# Patient Record
Sex: Female | Born: 1985 | Race: Black or African American | Hispanic: No | Marital: Single | Smoking: Former smoker
Health system: Southern US, Community
[De-identification: ages and names within clinical notes are randomized; demographics above are authoritative.]

## PROBLEM LIST (undated history)

## (undated) ENCOUNTER — Inpatient Hospital Stay (HOSPITAL_COMMUNITY): Payer: Self-pay

## (undated) DIAGNOSIS — D649 Anemia, unspecified: Secondary | ICD-10-CM

---
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
## 2006-05-12 ENCOUNTER — Inpatient Hospital Stay (HOSPITAL_COMMUNITY): Admission: AD | Admit: 2006-05-12 | Discharge: 2006-05-13 | Payer: Self-pay | Admitting: Obstetrics & Gynecology

## 2006-10-24 ENCOUNTER — Inpatient Hospital Stay (HOSPITAL_COMMUNITY): Admission: AD | Admit: 2006-10-24 | Discharge: 2006-10-24 | Payer: Self-pay | Admitting: Gynecology

## 2006-10-24 IMAGING — US US OB TRANSVAGINAL MODIFY
1 series · 14 of 28 positions shown · non-contrast
Comparison: none

CLINICAL DATA: 20-year-old, gravida 1, para 0, with LMP [DATE].  Pain, nausea.  

 OBSTETRICAL ULTRASOUND <14 WKS AND TRANSVAGINAL OB US:
 Number of Fetuses:  1
 Heart Rate:  136 bpm
 CRL:  0.73 cm  6 w 4 d     US EDC:  [DATE]
 Fetal anatomy could not be evaluated due to the early gestational age.
 MATERNAL UTERINE AND ADNEXAL FINDINGS:
 Cervix:  Closed; not measured.

[Series 1: us ob transvaginal modify · 0.24mm/px · 14 of 39 slices shown]
[im 2/39]
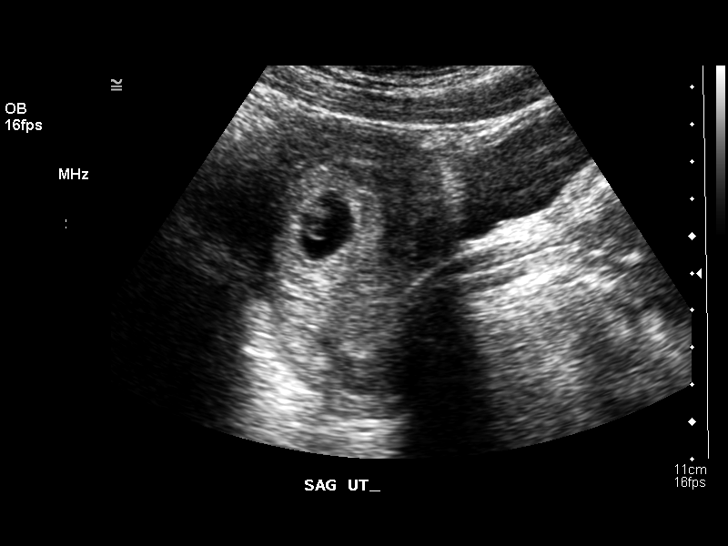
[im 5/39]
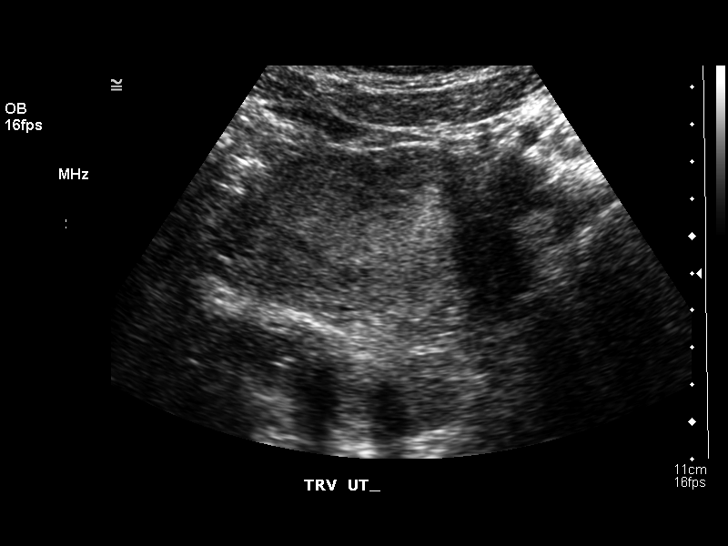
[im 8/39]
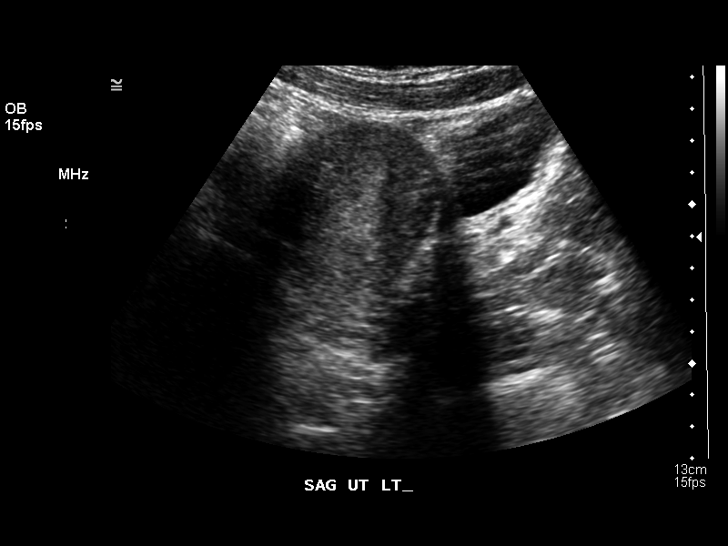
[im 10/39]
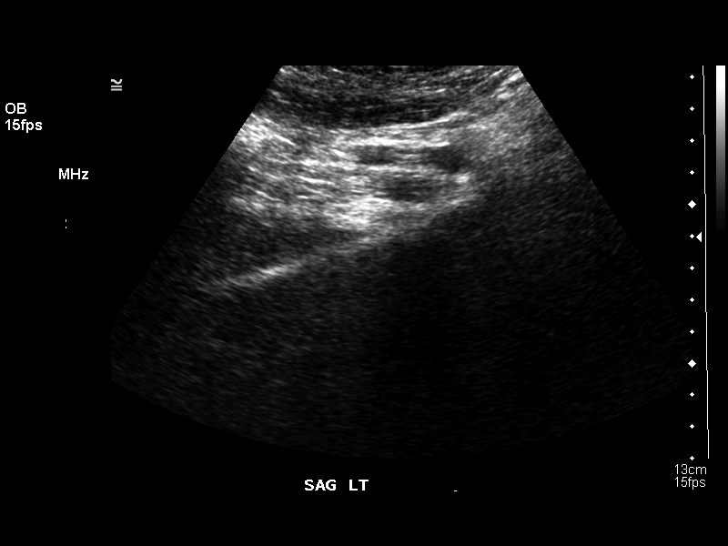
[im 13/39]
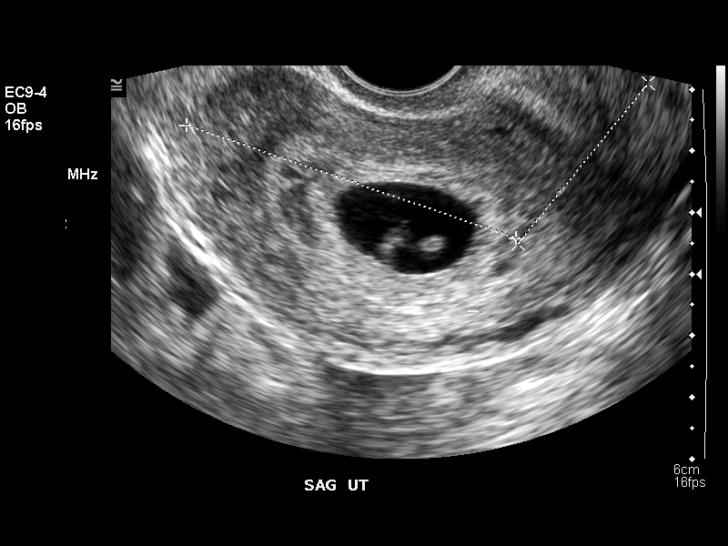
[im 16/39]
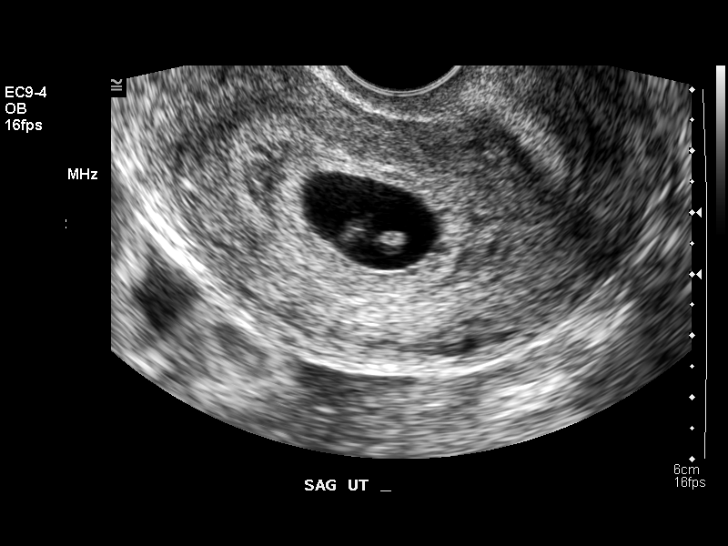
[im 19/39]
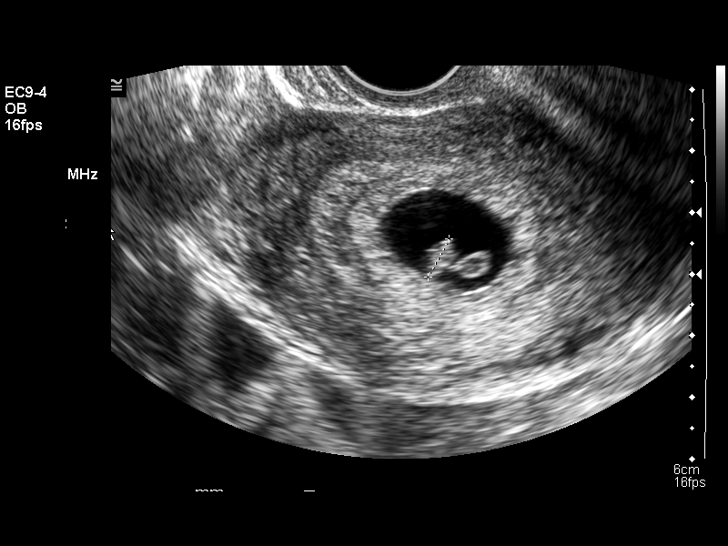
[im 22/39]
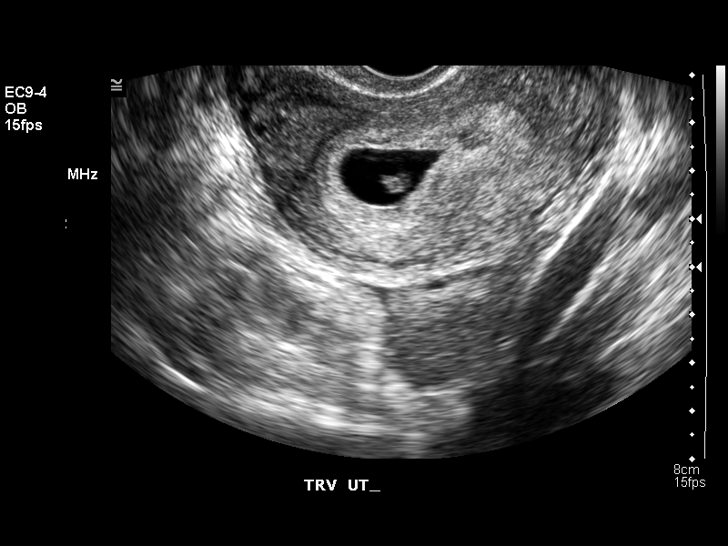
[im 24/39]
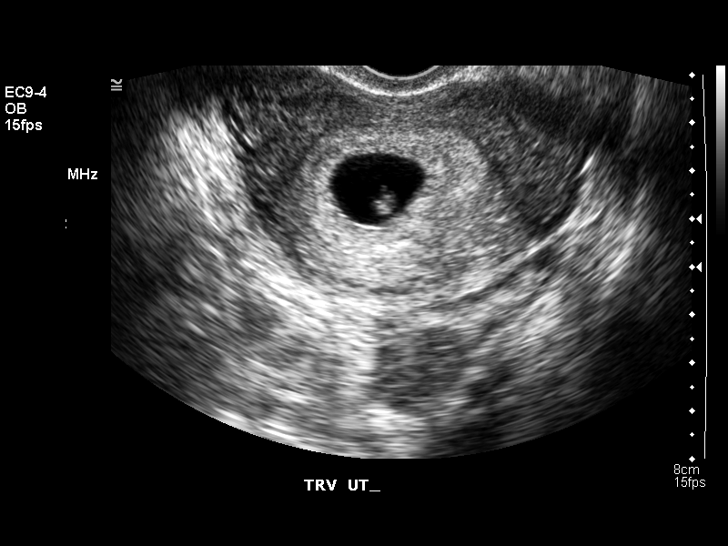
[im 27/39]
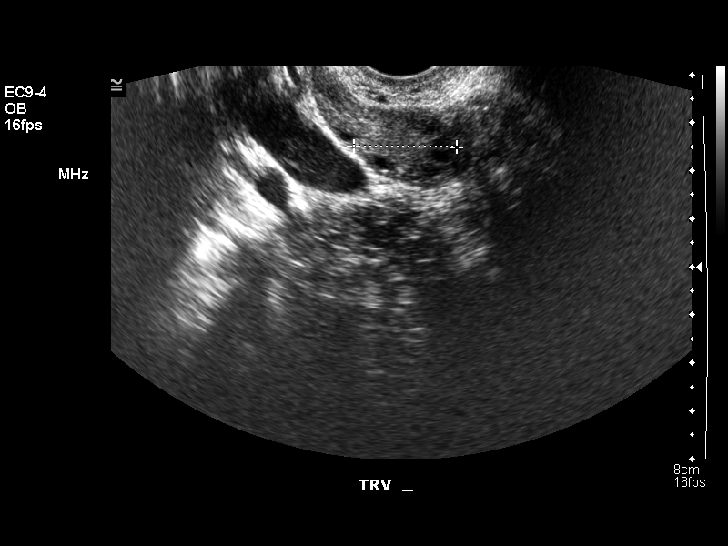
[im 30/39]
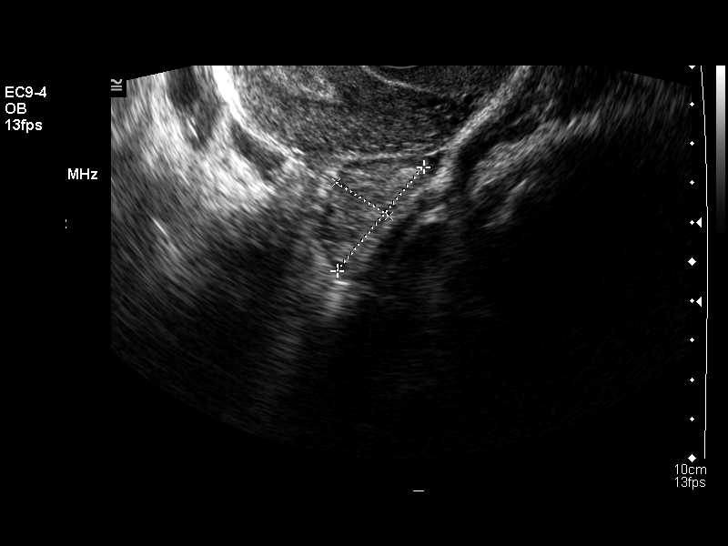
[im 33/39]
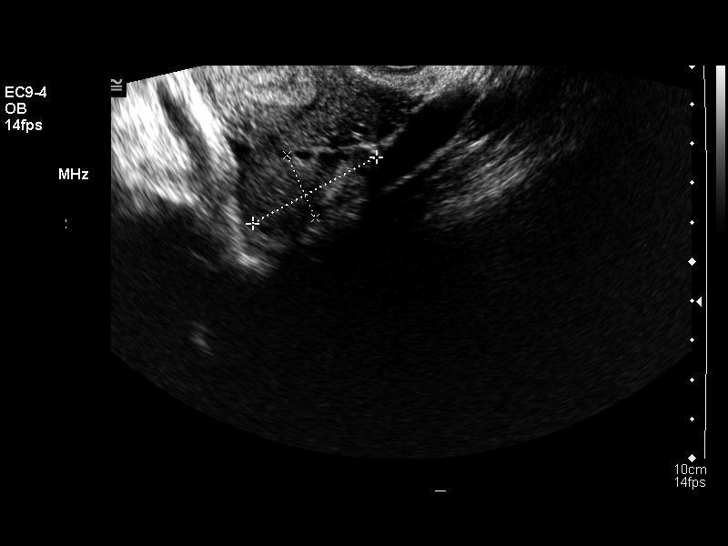
[im 36/39]
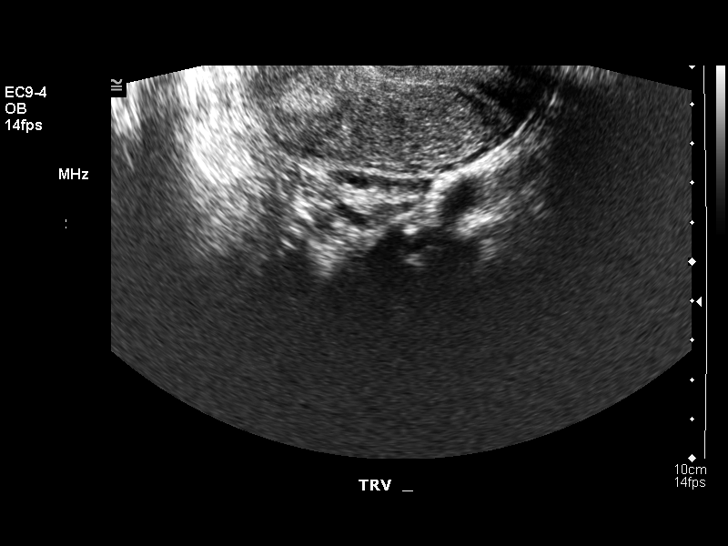
[im 39/39]
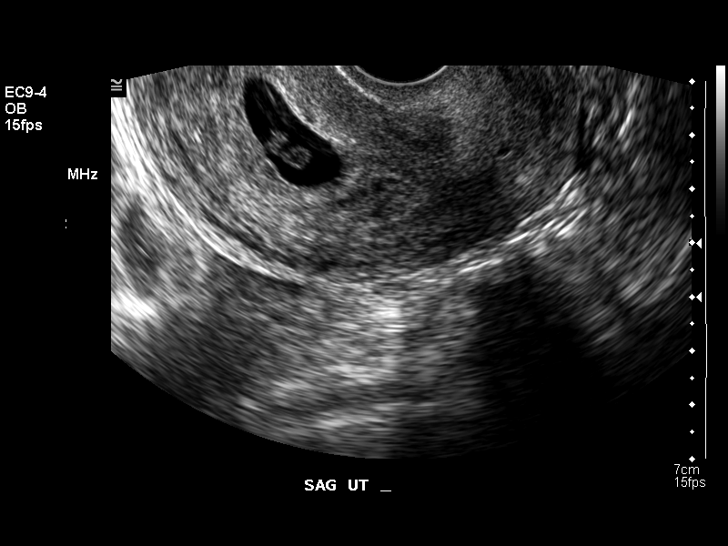

[14 of 28 positions shown; findings below may reference images not displayed]

IMPRESSION: Single living intrauterine fetus in variable presentation.  Size by ultrasound is within 4 days of the dating by the patient?s last menstrual period.  EDC by LMP is [DATE].

## 2006-11-30 ENCOUNTER — Inpatient Hospital Stay (HOSPITAL_COMMUNITY): Admission: AD | Admit: 2006-11-30 | Discharge: 2006-11-30 | Payer: Self-pay | Admitting: Obstetrics & Gynecology

## 2006-12-06 ENCOUNTER — Ambulatory Visit (HOSPITAL_COMMUNITY): Admission: RE | Admit: 2006-12-06 | Discharge: 2006-12-06 | Payer: Self-pay | Admitting: Family Medicine

## 2006-12-27 ENCOUNTER — Ambulatory Visit (HOSPITAL_COMMUNITY): Admission: RE | Admit: 2006-12-27 | Discharge: 2006-12-27 | Payer: Self-pay | Admitting: *Deleted

## 2007-01-09 ENCOUNTER — Ambulatory Visit (HOSPITAL_COMMUNITY): Admission: RE | Admit: 2007-01-09 | Discharge: 2007-01-09 | Payer: Self-pay | Admitting: Family Medicine

## 2007-05-04 ENCOUNTER — Inpatient Hospital Stay (HOSPITAL_COMMUNITY): Admission: AD | Admit: 2007-05-04 | Discharge: 2007-05-04 | Payer: Self-pay | Admitting: Obstetrics & Gynecology

## 2007-06-07 ENCOUNTER — Inpatient Hospital Stay (HOSPITAL_COMMUNITY): Admission: AD | Admit: 2007-06-07 | Discharge: 2007-06-07 | Payer: Self-pay | Admitting: Obstetrics & Gynecology

## 2007-06-11 ENCOUNTER — Inpatient Hospital Stay (HOSPITAL_COMMUNITY): Admission: AD | Admit: 2007-06-11 | Discharge: 2007-06-11 | Payer: Self-pay | Admitting: Obstetrics & Gynecology

## 2007-06-12 ENCOUNTER — Inpatient Hospital Stay (HOSPITAL_COMMUNITY): Admission: AD | Admit: 2007-06-12 | Discharge: 2007-06-12 | Payer: Self-pay | Admitting: Obstetrics & Gynecology

## 2007-06-13 ENCOUNTER — Inpatient Hospital Stay (HOSPITAL_COMMUNITY): Admission: AD | Admit: 2007-06-13 | Discharge: 2007-06-16 | Payer: Self-pay | Admitting: Obstetrics

## 2007-06-14 ENCOUNTER — Encounter: Payer: Self-pay | Admitting: Obstetrics & Gynecology

## 2007-11-29 ENCOUNTER — Inpatient Hospital Stay (HOSPITAL_COMMUNITY): Admission: AD | Admit: 2007-11-29 | Discharge: 2007-11-29 | Payer: Self-pay | Admitting: Obstetrics & Gynecology

## 2007-12-02 ENCOUNTER — Inpatient Hospital Stay (HOSPITAL_COMMUNITY): Admission: AD | Admit: 2007-12-02 | Discharge: 2007-12-02 | Payer: Self-pay | Admitting: Obstetrics & Gynecology

## 2007-12-26 ENCOUNTER — Inpatient Hospital Stay (HOSPITAL_COMMUNITY): Admission: AD | Admit: 2007-12-26 | Discharge: 2007-12-26 | Payer: Self-pay | Admitting: Family Medicine

## 2008-02-21 ENCOUNTER — Ambulatory Visit (HOSPITAL_COMMUNITY): Admission: RE | Admit: 2008-02-21 | Discharge: 2008-02-21 | Payer: Self-pay | Admitting: Obstetrics & Gynecology

## 2008-06-10 ENCOUNTER — Inpatient Hospital Stay (HOSPITAL_COMMUNITY): Admission: AD | Admit: 2008-06-10 | Discharge: 2008-06-10 | Payer: Self-pay | Admitting: Obstetrics and Gynecology

## 2008-06-13 ENCOUNTER — Ambulatory Visit (HOSPITAL_COMMUNITY): Admission: RE | Admit: 2008-06-13 | Discharge: 2008-06-13 | Payer: Self-pay | Admitting: Obstetrics & Gynecology

## 2008-07-11 ENCOUNTER — Inpatient Hospital Stay (HOSPITAL_COMMUNITY): Admission: AD | Admit: 2008-07-11 | Discharge: 2008-07-13 | Payer: Self-pay | Admitting: Obstetrics & Gynecology

## 2009-05-14 ENCOUNTER — Inpatient Hospital Stay (HOSPITAL_COMMUNITY): Admission: AD | Admit: 2009-05-14 | Discharge: 2009-05-14 | Payer: Self-pay | Admitting: Obstetrics & Gynecology

## 2009-10-29 ENCOUNTER — Emergency Department (HOSPITAL_COMMUNITY): Admission: EM | Admit: 2009-10-29 | Discharge: 2009-10-29 | Payer: Self-pay | Admitting: Family Medicine

## 2009-11-15 ENCOUNTER — Emergency Department (HOSPITAL_COMMUNITY): Admission: EM | Admit: 2009-11-15 | Discharge: 2009-11-15 | Payer: Self-pay | Admitting: Emergency Medicine

## 2010-10-01 ENCOUNTER — Emergency Department (HOSPITAL_COMMUNITY): Admission: EM | Admit: 2010-10-01 | Discharge: 2010-10-01 | Payer: Self-pay | Admitting: Family Medicine

## 2010-11-25 ENCOUNTER — Emergency Department (HOSPITAL_COMMUNITY)
Admission: EM | Admit: 2010-11-25 | Discharge: 2010-11-25 | Payer: Self-pay | Source: Home / Self Care | Admitting: Emergency Medicine

## 2010-12-20 NOTE — L&D Delivery Note (Signed)
Delivery Note At 7:45 AM a viable female was delivered via Vaginal, Spontaneous Delivery (Presentation: ;Vtx ).   Placenta status: Intact, Spontaneous.  Cord: 3 vessels.    Anesthesia: None  Episiotomy: None Lacerations: None  Est. Blood Loss (mL): 300 ml  Mom to postpartum.  Baby to NICU.  JACKSON-MOORE,Shogo Larkey A 10/02/2011, 8:01 AM

## 2011-03-03 LAB — POCT URINALYSIS DIPSTICK
Bilirubin Urine: NEGATIVE
Glucose, UA: NEGATIVE mg/dL
Ketones, ur: NEGATIVE mg/dL
Nitrite: NEGATIVE
Protein, ur: NEGATIVE mg/dL
Specific Gravity, Urine: 1.01 (ref 1.005–1.030)
Urobilinogen, UA: 0.2 mg/dL (ref 0.0–1.0)
pH: 5.5 (ref 5.0–8.0)

## 2011-03-03 LAB — POCT PREGNANCY, URINE: Preg Test, Ur: NEGATIVE

## 2011-03-24 LAB — URINALYSIS, ROUTINE W REFLEX MICROSCOPIC
Bilirubin Urine: NEGATIVE
Glucose, UA: NEGATIVE mg/dL
Hgb urine dipstick: NEGATIVE
Ketones, ur: NEGATIVE mg/dL
Nitrite: NEGATIVE
Protein, ur: 30 mg/dL — AB
Specific Gravity, Urine: 1.019 (ref 1.005–1.030)
Urobilinogen, UA: 0.2 mg/dL (ref 0.0–1.0)
pH: 6.5 (ref 5.0–8.0)

## 2011-03-24 LAB — URINE MICROSCOPIC-ADD ON

## 2011-03-24 LAB — POCT INFECTIOUS MONO SCREEN: Mono Screen: NEGATIVE

## 2011-03-30 LAB — URINALYSIS, ROUTINE W REFLEX MICROSCOPIC
Bilirubin Urine: NEGATIVE
Hgb urine dipstick: NEGATIVE
Nitrite: NEGATIVE
Specific Gravity, Urine: 1.015 (ref 1.005–1.030)
Urobilinogen, UA: 0.2 mg/dL (ref 0.0–1.0)
pH: 6 (ref 5.0–8.0)

## 2011-04-02 ENCOUNTER — Inpatient Hospital Stay (HOSPITAL_COMMUNITY)
Admission: AD | Admit: 2011-04-02 | Discharge: 2011-04-02 | Disposition: A | Discharge status: Home / Self Care | Payer: Medicaid Other | Source: Ambulatory Visit | Attending: Obstetrics & Gynecology | Admitting: Obstetrics & Gynecology

## 2011-04-02 DIAGNOSIS — O9989 Other specified diseases and conditions complicating pregnancy, childbirth and the puerperium: Secondary | ICD-10-CM

## 2011-04-02 DIAGNOSIS — O99891 Other specified diseases and conditions complicating pregnancy: Secondary | ICD-10-CM | POA: Insufficient documentation

## 2011-04-02 LAB — URINALYSIS, ROUTINE W REFLEX MICROSCOPIC
Glucose, UA: NEGATIVE mg/dL
Ketones, ur: NEGATIVE mg/dL
Nitrite: NEGATIVE
Protein, ur: NEGATIVE mg/dL
Urobilinogen, UA: 0.2 mg/dL (ref 0.0–1.0)

## 2011-04-02 LAB — POCT PREGNANCY, URINE: Preg Test, Ur: POSITIVE

## 2011-04-14 ENCOUNTER — Inpatient Hospital Stay (HOSPITAL_COMMUNITY)
Admission: AD | Admit: 2011-04-14 | Discharge: 2011-04-14 | Disposition: A | Discharge status: Home / Self Care | Payer: Medicaid Other | Source: Ambulatory Visit | Attending: Obstetrics & Gynecology | Admitting: Obstetrics & Gynecology

## 2011-04-14 ENCOUNTER — Inpatient Hospital Stay (HOSPITAL_COMMUNITY): Payer: Medicaid Other

## 2011-04-14 DIAGNOSIS — B9689 Other specified bacterial agents as the cause of diseases classified elsewhere: Secondary | ICD-10-CM | POA: Insufficient documentation

## 2011-04-14 DIAGNOSIS — O210 Mild hyperemesis gravidarum: Secondary | ICD-10-CM | POA: Insufficient documentation

## 2011-04-14 DIAGNOSIS — O2390 Unspecified genitourinary tract infection in pregnancy, unspecified trimester: Secondary | ICD-10-CM

## 2011-04-14 DIAGNOSIS — A499 Bacterial infection, unspecified: Secondary | ICD-10-CM

## 2011-04-14 DIAGNOSIS — R109 Unspecified abdominal pain: Secondary | ICD-10-CM

## 2011-04-14 DIAGNOSIS — N760 Acute vaginitis: Secondary | ICD-10-CM | POA: Insufficient documentation

## 2011-04-14 LAB — CBC
Platelets: 220 10*3/uL (ref 150–400)
RBC: 4.29 MIL/uL (ref 3.87–5.11)
RDW: 13.5 % (ref 11.5–15.5)
WBC: 5.2 10*3/uL (ref 4.0–10.5)

## 2011-04-14 LAB — URINALYSIS, ROUTINE W REFLEX MICROSCOPIC
Hgb urine dipstick: NEGATIVE
Nitrite: NEGATIVE
Protein, ur: NEGATIVE mg/dL
Specific Gravity, Urine: 1.025 (ref 1.005–1.030)
Urobilinogen, UA: 0.2 mg/dL (ref 0.0–1.0)

## 2011-04-14 LAB — HCG, QUANTITATIVE, PREGNANCY: hCG, Beta Chain, Quant, S: 147044 m[IU]/mL — ABNORMAL HIGH (ref <5)

## 2011-04-14 LAB — WET PREP, GENITAL: Trich, Wet Prep: NONE SEEN

## 2011-04-14 LAB — ABO/RH: ABO/RH(D): AB POS

## 2011-04-15 LAB — GC/CHLAMYDIA PROBE AMP, GENITAL
Chlamydia, DNA Probe: NEGATIVE
GC Probe Amp, Genital: NEGATIVE

## 2011-04-16 ENCOUNTER — Inpatient Hospital Stay (HOSPITAL_COMMUNITY)
Admission: AD | Admit: 2011-04-16 | Discharge: 2011-04-16 | Disposition: A | Discharge status: Home / Self Care | Payer: Medicaid Other | Source: Ambulatory Visit | Attending: Obstetrics | Admitting: Obstetrics

## 2011-04-16 ENCOUNTER — Inpatient Hospital Stay (HOSPITAL_COMMUNITY): Payer: Medicaid Other

## 2011-04-16 DIAGNOSIS — O209 Hemorrhage in early pregnancy, unspecified: Secondary | ICD-10-CM | POA: Insufficient documentation

## 2011-04-16 LAB — CBC
Hemoglobin: 11.7 g/dL — ABNORMAL LOW (ref 12.0–15.0)
MCH: 28.5 pg (ref 26.0–34.0)
MCHC: 34.1 g/dL (ref 30.0–36.0)
RDW: 13.6 % (ref 11.5–15.5)

## 2011-05-04 NOTE — H&P (Signed)
Dana [name], Dana [name]                 ACCOUNT NO.:  [id number]   MEDICAL RECORD NO.:  [id number]          PATIENT TYPE:  INP   LOCATION:  9121                          FACILITY:  [facility]   PHYSICIAN:  Roseanna Rainbow, M.D.DATE OF BIRTH:  Sep 13, 1986   DATE OF ADMISSION:  07/11/2008  DATE OF DISCHARGE:                              HISTORY & PHYSICAL   CHIEF COMPLAINT:  The patient is a 25 year old para 1 with an  intrauterine pregnancy at 75 plus weeks complaining of contractions.   HISTORY OF PRESENT ILLNESS:  Please see the above.   MEDICATIONS:  None.   SOCIAL HISTORY:  She denies any tobacco, ethanol, or drug use.   OB RISK FACTORS:  None.   PAST MEDICAL HISTORY:  Noncontributory.   ALLERGIES:  No known drug allergies.   PAST OBSTETRICAL HISTORY:  There is a history of a spontaneous vaginal  delivery at term.   PRENATAL LABS:  Blood type is AB positive and antibody screen negative.  Hemoglobin 10.5, hematocrit 31.7, and platelets 246,000.  RPR  nonreactive.  HIV nonreactive.  Chlamydia probe negative.  GC probe  negative.  GBS positive on July 6.   On physical exam, vital signs are stable, afebrile.  Fetal heart tracing  reassuring.  Tocodynamometer, uterine contractions every 2-4 minutes.  Sterile vaginal exam, per the RN on admission, 4-5 cm, 80% effaced with  vertex at -2 station.  On exam, approximately 30 minutes prior to  presentation, the cervix was 9 cm dilated with a bulging bag of water.   ASSESSMENT:  Primipara at term, active labor, positive GBS.  Fetal heart  tracing consistent with fetal well-being.   PLAN:  Admission, expectant management, penicillin, GBS prophylaxis.      Roseanna Rainbow, M.D.  Electronically Signed     LAJ/MEDQ  D:  07/11/2008  T:  07/11/2008  Job:  [id number]

## 2011-05-04 NOTE — H&P (Signed)
NAMESULEYMA, Dana [name]                 ACCOUNT NO.:  [id number]   MEDICAL RECORD NO.:  [id number]          PATIENT TYPE:  INP   LOCATION:  9162                          FACILITY:  [facility]   PHYSICIAN:  Roseanna Rainbow, M.D.DATE OF BIRTH:  1986/06/19   DATE OF ADMISSION:  06/13/2007  DATE OF DISCHARGE:                              HISTORY & PHYSICAL   CHIEF COMPLAINT:  The patient is a 25 year old, Gravida 1, para 0 with  an estimated date of confinement of June 23 with an intrauterine  pregnancy at 40+ weeks complaining of uterine contractions.   HISTORY OF PRESENT ILLNESS:  Please see the above.   ALLERGIES:  NO KNOWN DRUG ALLERGIES.   MEDICATIONS:  Prenatal vitamins.   OB RISK FACTORS:  Sickle-cell trait, HSIL Pap smear.   LABS:  Hemoglobin 11.4, hematocrit 34.4, 1-hour GCT 82, HIV non-  reactive, RPR non-reactive, Chlamydia DNA probe negative, GC probe  negative, Pap smear April 14 ASCUS with high-risk HPV.  GBS negative.   PAST GYN HISTORY:  History of gonorrhea and Chlamydia.   PAST MEDICAL HISTORY:  She denies.   PAST SURGICAL HISTORY:  Breast reduction.   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  Does not give any significant history of alcohol usage.  Has no  significant smoking history.  Denies illicit drug use.   FAMILY HISTORY:  Adult-onset diabetes, asthma.   PHYSICAL EXAM:  VITAL SIGNS:  Temperature 97.4, pulse 88, respirations  20, blood pressure 124/77.  GENERAL:  Uncomfortable.  ABDOMEN:  Gravid.  STERILE VAGINAL EXAM:  Cervix is 3 cm dilated, 80% effaced with the  vertex at  -1.  Fetal heart tracing reassuring tocodynamometer with  uterine contractions every 2 minutes.   ASSESSMENT:  Primigravida at term, latent labor, fetal heart tracing  consistent with fetal well-being.   PLAN:  Admission and expectant management.      Roseanna Rainbow, M.D.  Electronically Signed     LAJ/MEDQ  D:  06/13/2007  T:   06/13/2007  Job:  [id number]

## 2011-05-07 NOTE — Op Note (Signed)
NAME:  Dana [name], Dana [name]                 ACCOUNT NO.:  [id number]   MEDICAL RECORD NO.:  [id number]          PATIENT TYPE:  AMB   LOCATION:  DSC                          FACILITY:  [facility]   PHYSICIAN:  Earvin Hansen L. Shon Hough, M.D.DATE OF BIRTH:  1986/10/30   DATE OF PROCEDURE:  11/29/2005  DATE OF DISCHARGE:  11/29/2005                                 OPERATIVE REPORT   HISTORY:  [age]-year old with severe, severe macromastia, back and  shoulder pain secondary to large pendulous breasts.  She has a history of  intertriginous changes, rash formation in the middle of her breast areas and  axillae regions.  Patient has increased pitting of both shoulders causing  increased irritation and pain and difficult.  All conservative measures have  been tried and have failed.  She is now being prepared for bilateral breast  reductions using the inferior pedicle technique for reduction.  The patient  was setup and drawn for the inferior pedicle reduction mammoplasty.  Remarking the nipple-areolar complexes back to 22 cm from the suprasternal  notch.  She then underwent general anesthesia, intubated orally.  Prep was  done to the chest and breast areas in the routine fashion using Betadine  soap and solution, walled off with sterile towels and drapes so as to make a  sterile field.  One-quarter percent Xylocaine with epinephrine was injected,  locally, 150 cc per side for vasoconstriction.  After awaiting appropriate  amount of time, the markings were scored with #15 blades.  The skin over the  inferior pedicle was de-epithelized with #20 blades.  Medial and lateral  fatty dermal pedicles were excised down to underlying pectoralis major  muscle fascia.  After proper hemostasis, a new keyhole area was also  debulked and then the flaps were transposed and stayed with 3-0 Prolene  sutures.  Subcutaneous closure was done with 2-0 Monocryl x2 layers, then  running subcuticular stitches with 3-0 Monocryl and 5-0  Monocryl throughout  the inverted T.  The wound was redrained with #10 Blake drains, fully  fluted, placed in the depths of the wound and brought out through the  lateral most portion of the incision and secured with 3-0 Prolene.  The  wounds were cleansed.  Sterile dressings were applied, including Xeroform  gauze, Steri-Strips, 4 x 4s, ABDs, Hypafix tape.  She withstood the  procedures very well, was taken to recovery in excellent condition.  Estimated blood loss was less than 125 cc.   COMPLICATIONS:  None.      Yaakov Guthrie. Shon Hough, M.D.  Electronically Signed     GLT/MEDQ  D:  12/01/2005  T:  12/01/2005  Job:  [id number]

## 2011-05-07 NOTE — Op Note (Signed)
NAME:  Dana [name], Dana [name]                 ACCOUNT NO.:  [id number]   MEDICAL RECORD NO.:  [id number]          PATIENT TYPE:  AMB   LOCATION:  DSC                          FACILITY:  [facility]   PHYSICIAN:  Earvin Hansen L. [place], M.D.DATE OF BIRTH:  [birth year]   DATE OF PROCEDURE:  11/29/2005  DATE OF DISCHARGE:                                 OPERATIVE REPORT   An 25 year old with severe, severe macromastia, back and shoulder pain  secondary to large, pendulous breasts, history of intertriginous changes,  rash formation in the middle of the breast areas and axilla regions.  Patient has increased pitting in both shoulder areas causing irritation and  pain and discomfort.  All conservative measures have been tried and have  failed.   PROCEDURES PLANNED:  Bilateral breast reductions using the inferior pedicle  technique.  Procedure in detail as well as the attendant risks have been  explained to the patient and her mother.  All possible complications and  risks are discussed.  Patient wishes to proceed with surgery.  Preoperatively patient set up and drawn for the reduction and mammoplasty  remarking the nipple areolar complexes from over 30 cm to 22 from the  suprasternal notch.  The markings were highlighted then she was taken to the  operating room, placed on the operating room table in the supine position.  Was given adequate general anesthesia, intubated orally.  Prep was done to  the chest/breast areas in a routine fashion using Betadine soap and  solution, walled off with sterile towels and drapes so as to make a sterile  field.  The right and left breast areas were infiltrated with 0.25%  Xylocaine with epinephrine 1:400,000 concentration, a total of 150 mL per  side.  The wounds were scored with #15 blades.  The skin over the inferior  pedicle was de-epithelialized with #20 blades.  Medial and lateral fatty  dermal pedicles were excised down to underlying pectoralis major fascia.  After  proper hemostasis a new keyhole area was also debulked.  After  hemostasis the flaps were transposed and stayed with 3-0 Prolene.  Subcutaneous closure was done with 2-0 Monocryl x2 layers and a running  subcuticular stitch of 3-0 Monocryl, 5-0 Monocryl throughout the inverted T.  The wounds were drained with a #10 Blake drain fully fluted flap placed in  the depths of the wound and then brought out through the lateralest portion  of the incision, secured with 3-0 Prolene.  The wounds were cleansed.  Sterile dressings were applied including hypafix  4 x 4s, Steri-Strips,  ABDs, and Hypafix tape.  She withstood the procedures very well, was taken  to recovery in excellent condition.   ESTIMATED BLOOD LOSS:  Less than 125 mL.   COMPLICATIONS:  None.      Yaakov Guthrie. Shon Hough, M.D.  Electronically Signed     GLT/MEDQ  D:  11/29/2005  T:  11/29/2005  Job:  [id number]

## 2011-06-02 ENCOUNTER — Inpatient Hospital Stay (HOSPITAL_COMMUNITY)
Admission: AD | Admit: 2011-06-02 | Discharge: 2011-06-02 | Disposition: A | Discharge status: Home / Self Care | Payer: Medicaid Other | Source: Ambulatory Visit | Attending: Obstetrics | Admitting: Obstetrics

## 2011-06-02 DIAGNOSIS — B373 Candidiasis of vulva and vagina: Secondary | ICD-10-CM

## 2011-06-02 DIAGNOSIS — O2390 Unspecified genitourinary tract infection in pregnancy, unspecified trimester: Secondary | ICD-10-CM

## 2011-06-02 DIAGNOSIS — B3731 Acute candidiasis of vulva and vagina: Secondary | ICD-10-CM | POA: Insufficient documentation

## 2011-06-02 DIAGNOSIS — R109 Unspecified abdominal pain: Secondary | ICD-10-CM | POA: Insufficient documentation

## 2011-06-02 LAB — CBC
HCT: 33.3 % — ABNORMAL LOW (ref 36.0–46.0)
MCHC: 34.8 g/dL (ref 30.0–36.0)
MCV: 83.7 fL (ref 78.0–100.0)
Platelets: 212 10*3/uL (ref 150–400)
RDW: 13.2 % (ref 11.5–15.5)

## 2011-06-02 LAB — WET PREP, GENITAL: Trich, Wet Prep: NONE SEEN

## 2011-06-02 LAB — URINALYSIS, ROUTINE W REFLEX MICROSCOPIC
Glucose, UA: 100 mg/dL — AB
Ketones, ur: NEGATIVE mg/dL
Leukocytes, UA: NEGATIVE
Protein, ur: NEGATIVE mg/dL
Urobilinogen, UA: 0.2 mg/dL (ref 0.0–1.0)

## 2011-06-03 LAB — GC/CHLAMYDIA PROBE AMP, GENITAL: Chlamydia, DNA Probe: NEGATIVE

## 2011-07-08 ENCOUNTER — Encounter (HOSPITAL_COMMUNITY): Payer: Self-pay | Admitting: *Deleted

## 2011-07-08 ENCOUNTER — Inpatient Hospital Stay (HOSPITAL_COMMUNITY)
Admission: AD | Admit: 2011-07-08 | Discharge: 2011-07-08 | Disposition: A | Discharge status: Home / Self Care | Payer: Medicaid Other | Source: Ambulatory Visit | Attending: Obstetrics & Gynecology | Admitting: Obstetrics & Gynecology

## 2011-07-08 DIAGNOSIS — A5901 Trichomonal vulvovaginitis: Secondary | ICD-10-CM | POA: Insufficient documentation

## 2011-07-08 DIAGNOSIS — R109 Unspecified abdominal pain: Secondary | ICD-10-CM | POA: Insufficient documentation

## 2011-07-08 DIAGNOSIS — O98819 Other maternal infectious and parasitic diseases complicating pregnancy, unspecified trimester: Secondary | ICD-10-CM | POA: Insufficient documentation

## 2011-07-08 DIAGNOSIS — M545 Low back pain, unspecified: Secondary | ICD-10-CM | POA: Insufficient documentation

## 2011-07-08 DIAGNOSIS — N949 Unspecified condition associated with female genital organs and menstrual cycle: Secondary | ICD-10-CM

## 2011-07-08 DIAGNOSIS — A599 Trichomoniasis, unspecified: Secondary | ICD-10-CM

## 2011-07-08 LAB — URINALYSIS, ROUTINE W REFLEX MICROSCOPIC
Glucose, UA: NEGATIVE mg/dL
Nitrite: NEGATIVE
Specific Gravity, Urine: 1.015 (ref 1.005–1.030)
pH: 6.5 (ref 5.0–8.0)

## 2011-07-08 LAB — WET PREP, GENITAL: Yeast Wet Prep HPF POC: NONE SEEN

## 2011-07-08 LAB — URINE MICROSCOPIC-ADD ON

## 2011-07-08 MED ORDER — METRONIDAZOLE 500 MG PO TABS: 500.0000 mg | ORAL_TABLET | Freq: Two times a day (BID) | ORAL | Status: AC

## 2011-07-08 NOTE — Progress Notes (Signed)
PT REPORTS SPOTTING X1 DAY.

## 2011-07-08 NOTE — Progress Notes (Signed)
Pain in lower abd  And back, comes and goes.  Spotting yesterday. Denies hx of low lying placenta or previa.  Hard to sleep

## 2011-07-08 NOTE — ED Provider Notes (Addendum)
History    patient is 25 year old black female gravida 3 para 2 at 20.3 weeks. Service today complaining of bilateral lower quadrant pain that is worse with movement. She also complains of low back pain. She denies vaginal discharge or bleeding. She reports good fetal movement.  Chief Complaint  Patient presents with  . Abdominal Pain   HPI  OB History    Grav Para Term Preterm Abortions TAB SAB Ect Mult Living   3 2 2       2       No past medical history on file.  No past surgical history on file.  No family history on file.  History  Substance Use Topics  . Smoking status: Never Smoker   . Smokeless tobacco: Not on file  . Alcohol Use: No    Allergies: Allergies not on file  No prescriptions prior to admission    Review of Systems  Constitutional: Negative for fever.  Cardiovascular: Negative for chest pain and palpitations.  Gastrointestinal: Positive for abdominal pain. Negative for nausea, vomiting, diarrhea and constipation.  Genitourinary: Negative for dysuria, urgency, frequency and hematuria.  Neurological: Negative for dizziness and headaches.  Psychiatric/Behavioral: Negative for depression and suicidal ideas.   Physical Exam   Blood pressure 112/73, pulse 99, temperature 98.9 F (37.2 C), temperature source Oral, resp. rate 18, height 5\' 3"  (1.6 m), weight 175 lb (79.379 kg), last menstrual period 02/15/2011.  Physical Exam  Constitutional: She is oriented to person, place, and time. She appears well-developed and well-nourished. No distress.  HENT:  Head: Normocephalic and atraumatic.  Eyes: EOM are normal. Pupils are equal, round, and reactive to light.  GI: Soft. She exhibits no distension. There is no tenderness. There is no rebound and no guarding.  Genitourinary: No bleeding around the vagina. Vaginal discharge found.       Cervix is long and closed. Uterus palpates to approximately 20 weeks size. There is a green frothy discharge present in the  vagina.  Neurological: She is alert and oriented to person, place, and time.  Skin: Skin is warm and dry. She is not diaphoretic.  Psychiatric: She has a normal mood and affect. Her behavior is normal. Judgment and thought content normal.    MAU Course  Procedures  Wet prep was performed. Gonorrhea and Chlamydia cultures will be obtained from her urine.  Assessment and Plan  1) round ligament pain: I did discuss this with the patient at length. She has a followup appointment in the office. I did discuss appropriate diet, activities, risks and precautions.  2) Trichomonas: I did discuss this with her at length. She'll be treated with Flagyl 500 mg to take 1 by mouth twice a day for 7 days. I did warn her the Antabuse type reaction if she were to drink alcohol. She understood and agreed. I also discussed safe sex precautions. I advised that her partner be seen and treated at the health department. She understands that she needs to avoid intercourse with this person. She had no other questions or problems at this time.  Clinton Gallant. Kymber Kosar III, DrHSc, MPAS, PA-C  07/08/2011, 2:56 PM   Henrietta Hoover, PA 07/16/11 [phone]

## 2011-07-08 NOTE — Progress Notes (Signed)
PT C/O CONSTANT LOWER BACK PAIN AND BILATERAL  LOWER ABDOMINAL PAIN X1 WEEK. PT TAKING NOTHING FOR PAIN. SPOTTING YESTERDAY BEFORE INTERCOURSE.

## 2011-07-19 ENCOUNTER — Encounter (HOSPITAL_COMMUNITY): Payer: Self-pay

## 2011-07-19 ENCOUNTER — Inpatient Hospital Stay (HOSPITAL_COMMUNITY): Payer: Medicaid Other

## 2011-07-19 ENCOUNTER — Inpatient Hospital Stay (HOSPITAL_COMMUNITY)
Admission: AD | Admit: 2011-07-19 | Discharge: 2011-07-19 | Disposition: A | Discharge status: Home / Self Care | Payer: Medicaid Other | Source: Ambulatory Visit | Attending: Obstetrics | Admitting: Obstetrics

## 2011-07-19 DIAGNOSIS — O9989 Other specified diseases and conditions complicating pregnancy, childbirth and the puerperium: Secondary | ICD-10-CM | POA: Insufficient documentation

## 2011-07-19 DIAGNOSIS — K5289 Other specified noninfective gastroenteritis and colitis: Secondary | ICD-10-CM

## 2011-07-19 DIAGNOSIS — O4690 Antepartum hemorrhage, unspecified, unspecified trimester: Secondary | ICD-10-CM

## 2011-07-19 DIAGNOSIS — O4692 Antepartum hemorrhage, unspecified, second trimester: Secondary | ICD-10-CM

## 2011-07-19 DIAGNOSIS — K529 Noninfective gastroenteritis and colitis, unspecified: Secondary | ICD-10-CM

## 2011-07-19 LAB — URINALYSIS, ROUTINE W REFLEX MICROSCOPIC
Bilirubin Urine: NEGATIVE
Hgb urine dipstick: NEGATIVE
Ketones, ur: NEGATIVE mg/dL
Protein, ur: NEGATIVE mg/dL
Urobilinogen, UA: 0.2 mg/dL (ref 0.0–1.0)

## 2011-07-19 LAB — WET PREP, GENITAL: Clue Cells Wet Prep HPF POC: NONE SEEN

## 2011-07-19 LAB — CBC
HCT: 30.8 % — ABNORMAL LOW (ref 36.0–46.0)
MCH: 29 pg (ref 26.0–34.0)
MCHC: 35.1 g/dL (ref 30.0–36.0)
MCV: 82.6 fL (ref 78.0–100.0)
RDW: 12.5 % (ref 11.5–15.5)

## 2011-07-19 MED ORDER — ONDANSETRON 8 MG PO TBDP
8.0000 mg | ORAL_TABLET | Freq: Once | ORAL | Status: AC
  Administered 2011-07-19: 8 mg via ORAL
  Filled 2011-07-19: qty 1

## 2011-07-19 MED ORDER — ONDANSETRON HCL 4 MG PO TABS: 8.0000 mg | ORAL_TABLET | Freq: Three times a day (TID) | ORAL | Status: DC | PRN

## 2011-07-19 NOTE — ED Provider Notes (Signed)
EMILE RINGGENBERG 07/14/1986 Results for orders placed during the hospital encounter of 07/19/11 (from the past 24 hour(s))  URINALYSIS, ROUTINE W REFLEX MICROSCOPIC     Status: Normal   Collection Time   07/19/11  2:08 PM      Component Value Range   Color, Urine YELLOW  YELLOW    Appearance CLEAR  CLEAR    Specific Gravity, Urine 1.010  1.005 - 1.030    pH 7.5  5.0 - 8.0    Glucose, UA NEGATIVE  NEGATIVE (mg/dL)   Hgb urine dipstick NEGATIVE  NEGATIVE    Bilirubin Urine NEGATIVE  NEGATIVE    Ketones, ur NEGATIVE  NEGATIVE (mg/dL)   Protein, ur NEGATIVE  NEGATIVE (mg/dL)   Urobilinogen, UA 0.2  0.0 - 1.0 (mg/dL)   Nitrite NEGATIVE  NEGATIVE    Leukocytes, UA NEGATIVE  NEGATIVE   CBC     Status: Abnormal   Collection Time   07/19/11  2:17 PM      Component Value Range   WBC 7.4  4.0 - 10.5 (K/uL)   RBC 3.73 (*) 3.87 - 5.11 (MIL/uL)   Hemoglobin 10.8 (*) 12.0 - 15.0 (g/dL)   HCT 16.1 (*) 09.6 - 46.0 (%)   MCV 82.6  78.0 - 100.0 (fL)   MCH 29.0  26.0 - 34.0 (pg)   MCHC 35.1  30.0 - 36.0 (g/dL)   RDW 04.5  40.9 - 81.1 (%)   Platelets 238  150 - 400 (K/uL)  WET PREP, GENITAL     Status: Abnormal   Collection Time   07/19/11  3:13 PM      Component Value Range   Yeast, Wet Prep NONE SEEN  NONE SEEN    Trich, Wet Prep NONE SEEN  NONE SEEN    Clue Cells, Wet Prep NONE SEEN  NONE SEEN    WBC, Wet Prep HPF POC FEW (*) NONE SEEN    [place]: SIUP, S=D, anterior placenta, above os, normal. CL: 3.4 cm  Assessment: 1. Second trimester bleeding of unknown etiology, resolved. No evidence or previa or abruption 2. Gastroenteritis vs reaction to Flagyl  Plan: 1. D/C home per consult w./ Dr. Clearance Coots 2. Pelvic rest a 1 week 3. F/U at [facility] as scheduled for NOB or PRN from bleeding or severe N/V 4. Rx Zofran

## 2011-07-19 NOTE — ED Provider Notes (Signed)
Dana [name] is a 25 y.o. female at 22.0 weeks by LPM presenting for nausea x 10 days that progressed to vomiting x 2 days and. She also reports passing a 2-5 cm clot in the toilet this morning w/ mild cramping at the time, but none since. She states that she has a NOB appt scheduled in August at [place], but has not initiated  [facility]. She has been seen in MAU twice for and pain and vaginal discharge and bleeding. She was Dx w/ a [facility] at one visit and Trich on 07/08/11 for which was Tx w/ Flagyl. She is tolerating water after taking Zofran. History OB History    Grav Para Term Preterm Abortions TAB SAB Ect Mult Living   3 2 2  0 0 0 0 0 0 2     History reviewed. No pertinent past medical history. Past Surgical History  Procedure Date  . Breast reduction surgery 2006   Family History: family history includes Diabetes in her mother. Social History:  reports that she has never smoked. She has never used smokeless tobacco. She reports that she does not drink alcohol or use illicit drugs.  Review of Systems  All other systems reviewed and are negative.    Dilation: Closed Effacement (%): Thick Exam by:: Dorathy Kinsman CNM Blood pressure 120/69, pulse 94, temperature 99.1 F (37.3 C), temperature source Oral, resp. rate 18, height 5\' 3"  (1.6 m), weight 79.379 kg (175 lb), last menstrual period 02/15/2011, SpO2 99.00%. Maternal Exam:  Uterine Assessment: none  Abdomen: Fundal height is S=D.    Introitus: Normal vulva. Normal vagina.  Vagina is negative for discharge.    Fetal Exam Fetal Monitor Review: Mode: ultrasound.   Baseline rate: 160's.      Physical Exam  Constitutional: She is oriented to person, place, and time. She appears well-developed and well-nourished. No distress.  Cardiovascular: Normal rate.   Respiratory: Effort normal.  GI: Soft. Bowel sounds are normal. There is tenderness (mild, diffuse).  Genitourinary: Vagina normal and uterus normal. Cervix exhibits no  discharge and no friability. No tenderness or bleeding around the vagina. No vaginal discharge found.  Neurological: She is alert and oriented to person, place, and time.  Skin: Skin is warm and dry.    Prenatal labs: ABO, Rh: AB POS (04/25 1445) Antibody:   Rubella:   RPR:    HBsAg:    HIV:    GBS:    Results for orders placed during the hospital encounter of 07/19/11 (from the past 24 hour(s))  URINALYSIS, ROUTINE W REFLEX MICROSCOPIC     Status: Normal   Collection Time   07/19/11  2:08 PM      Component Value Range   Color, Urine YELLOW  YELLOW    Appearance CLEAR  CLEAR    Specific Gravity, Urine 1.010  1.005 - 1.030    pH 7.5  5.0 - 8.0    Glucose, UA NEGATIVE  NEGATIVE (mg/dL)   Hgb urine dipstick NEGATIVE  NEGATIVE    Bilirubin Urine NEGATIVE  NEGATIVE    Ketones, ur NEGATIVE  NEGATIVE (mg/dL)   Protein, ur NEGATIVE  NEGATIVE (mg/dL)   Urobilinogen, UA 0.2  0.0 - 1.0 (mg/dL)   Nitrite NEGATIVE  NEGATIVE    Leukocytes, UA NEGATIVE  NEGATIVE   CBC     Status: Abnormal   Collection Time   07/19/11  2:17 PM      Component Value Range   WBC 7.4  4.0 - 10.5 (K/uL)  RBC 3.73 (*) 3.87 - 5.11 (MIL/uL)   Hemoglobin 10.8 (*) 12.0 - 15.0 (g/dL)   HCT 16.1 (*) 09.6 - 46.0 (%)   MCV 82.6  78.0 - 100.0 (fL)   MCH 29.0  26.0 - 34.0 (pg)   MCHC 35.1  30.0 - 36.0 (g/dL)   RDW 04.5  40.9 - 81.1 (%)   Platelets 238  150 - 400 (K/uL)   Assessment/Plan: Assessment: 1. VB of unknown etiology, resolved 2. N/V stable, due to Flagyl vs gastroenteritis. No dehydration, tol PO's  Plan: 1. Per consult w/ Dr. Clearance Coots, complete OB US 2. Wet Prep, GC/CT   [name] 07/19/2011, 3:25 PM

## 2011-07-19 NOTE — Progress Notes (Signed)
Pt states vomiting x2 days. Reports nausea since she began taking flagyl on 7/19. This morning with abd pain, sweating & vomiting and saw "blood clot" in toilet.

## 2011-07-20 LAB — GC/CHLAMYDIA PROBE AMP, GENITAL: Chlamydia, DNA Probe: NEGATIVE

## 2011-09-09 LAB — URINALYSIS, ROUTINE W REFLEX MICROSCOPIC
Ketones, ur: NEGATIVE
Nitrite: NEGATIVE
Urobilinogen, UA: 1
pH: 6.5

## 2011-09-09 LAB — URINE MICROSCOPIC-ADD ON

## 2011-09-16 LAB — URINALYSIS, ROUTINE W REFLEX MICROSCOPIC
Bilirubin Urine: NEGATIVE
Glucose, UA: NEGATIVE
Hgb urine dipstick: NEGATIVE
Ketones, ur: NEGATIVE
Nitrite: NEGATIVE
pH: 7

## 2011-09-16 LAB — URINE MICROSCOPIC-ADD ON

## 2011-09-16 LAB — WET PREP, GENITAL
Clue Cells Wet Prep HPF POC: NONE SEEN
Yeast Wet Prep HPF POC: NONE SEEN

## 2011-09-17 LAB — CBC
Hemoglobin: 8.8 — ABNORMAL LOW
MCHC: 32.8
MCHC: 33.3
MCV: 78.2
Platelets: 246
RBC: 3.43 — ABNORMAL LOW
RDW: 15.4
WBC: 9.9

## 2011-09-17 LAB — TYPE AND SCREEN
ABO/RH(D): AB POS
Antibody Screen: NEGATIVE

## 2011-09-24 ENCOUNTER — Encounter (HOSPITAL_COMMUNITY): Payer: Self-pay | Admitting: *Deleted

## 2011-09-24 ENCOUNTER — Inpatient Hospital Stay (HOSPITAL_COMMUNITY): Payer: Medicaid Other

## 2011-09-24 ENCOUNTER — Inpatient Hospital Stay (HOSPITAL_COMMUNITY)
Admission: AD | Admit: 2011-09-24 | Discharge: 2011-10-04 | DRG: 775 | Disposition: A | Discharge status: Home / Self Care | Payer: Medicaid Other | Source: Ambulatory Visit | Attending: Obstetrics & Gynecology | Admitting: Obstetrics & Gynecology

## 2011-09-24 DIAGNOSIS — O4290 Premature rupture of membranes, unspecified as to length of time between rupture and onset of labor, unspecified weeks of gestation: Principal | ICD-10-CM | POA: Diagnosis present

## 2011-09-24 DIAGNOSIS — O99019 Anemia complicating pregnancy, unspecified trimester: Secondary | ICD-10-CM

## 2011-09-24 DIAGNOSIS — D649 Anemia, unspecified: Secondary | ICD-10-CM | POA: Diagnosis not present

## 2011-09-24 DIAGNOSIS — O42919 Preterm premature rupture of membranes, unspecified as to length of time between rupture and onset of labor, unspecified trimester: Secondary | ICD-10-CM | POA: Diagnosis present

## 2011-09-24 DIAGNOSIS — O9903 Anemia complicating the puerperium: Secondary | ICD-10-CM | POA: Diagnosis not present

## 2011-09-24 LAB — URINE MICROSCOPIC-ADD ON

## 2011-09-24 LAB — CBC
MCV: 77.5 fL — ABNORMAL LOW (ref 78.0–100.0)
Platelets: 168 10*3/uL (ref 150–400)
RBC: 3.55 MIL/uL — ABNORMAL LOW (ref 3.87–5.11)
WBC: 6.5 10*3/uL (ref 4.0–10.5)

## 2011-09-24 LAB — URINALYSIS, ROUTINE W REFLEX MICROSCOPIC
Nitrite: NEGATIVE
Specific Gravity, Urine: 1.01 (ref 1.005–1.030)
Urobilinogen, UA: 4 mg/dL — ABNORMAL HIGH (ref 0.0–1.0)
pH: 8 (ref 5.0–8.0)

## 2011-09-24 LAB — RPR: RPR Ser Ql: NONREACTIVE

## 2011-09-24 MED ORDER — ACETAMINOPHEN 325 MG PO TABS
650.0000 mg | ORAL_TABLET | ORAL | Status: DC | PRN
  Administered 2011-09-27 – 2011-09-30 (×3): 650 mg via ORAL
  Filled 2011-09-24 (×3): qty 2

## 2011-09-24 MED ORDER — DOCUSATE SODIUM 100 MG PO CAPS
100.0000 mg | ORAL_CAPSULE | Freq: Every day | ORAL | Status: DC
  Administered 2011-09-24 – 2011-10-01 (×8): 100 mg via ORAL
  Filled 2011-09-24 (×8): qty 1

## 2011-09-24 MED ORDER — AMOXICILLIN 500 MG PO CAPS
500.0000 mg | ORAL_CAPSULE | Freq: Three times a day (TID) | ORAL | Status: AC
  Administered 2011-09-26 – 2011-10-01 (×15): 500 mg via ORAL
  Filled 2011-09-24 (×15): qty 1

## 2011-09-24 MED ORDER — SODIUM CHLORIDE 0.9 % IJ SOLN
3.0000 mL | Freq: Two times a day (BID) | INTRAMUSCULAR | Status: DC
  Administered 2011-09-24 – 2011-09-27 (×8): 3 mL via INTRAVENOUS

## 2011-09-24 MED ORDER — CALCIUM CARBONATE ANTACID 500 MG PO CHEW
2.0000 | CHEWABLE_TABLET | ORAL | Status: DC | PRN
  Administered 2011-09-29: 400 mg via ORAL
  Filled 2011-09-24 (×2): qty 2

## 2011-09-24 MED ORDER — BETAMETHASONE SOD PHOS & ACET 6 (3-3) MG/ML IJ SUSP
12.0000 mg | INTRAMUSCULAR | Status: AC
  Administered 2011-09-24 – 2011-09-25 (×2): 12 mg via INTRAMUSCULAR
  Filled 2011-09-24 (×2): qty 2

## 2011-09-24 MED ORDER — AZITHROMYCIN 500 MG PO TABS
500.0000 mg | ORAL_TABLET | Freq: Every day | ORAL | Status: AC
  Administered 2011-09-24 – 2011-09-30 (×7): 500 mg via ORAL
  Filled 2011-09-24 (×7): qty 1

## 2011-09-24 MED ORDER — ZOLPIDEM TARTRATE 10 MG PO TABS
10.0000 mg | ORAL_TABLET | Freq: Every evening | ORAL | Status: DC | PRN
  Administered 2011-09-24 – 2011-10-01 (×8): 10 mg via ORAL
  Filled 2011-09-24 (×8): qty 1

## 2011-09-24 MED ORDER — SODIUM CHLORIDE 0.9 % IV SOLN
2.0000 g | Freq: Four times a day (QID) | INTRAVENOUS | Status: AC
  Administered 2011-09-24 – 2011-09-26 (×8): 2 g via INTRAVENOUS
  Filled 2011-09-24 (×8): qty 2000

## 2011-09-24 MED ORDER — ONDANSETRON 4 MG PO TBDP
4.0000 mg | ORAL_TABLET | Freq: Four times a day (QID) | ORAL | Status: DC | PRN
  Administered 2011-09-24 – 2011-09-29 (×4): 4 mg via ORAL
  Filled 2011-09-24 (×5): qty 1

## 2011-09-24 MED ORDER — PRENATAL PLUS 27-1 MG PO TABS
1.0000 | ORAL_TABLET | Freq: Every day | ORAL | Status: DC
  Administered 2011-09-24 – 2011-10-01 (×8): 1 via ORAL
  Filled 2011-09-24 (×8): qty 1

## 2011-09-24 NOTE — Progress Notes (Signed)
UR Chart review completed.  

## 2011-09-24 NOTE — H&P (Signed)
Dana [name] is [name] 25 y.o. female presenting for SROM. Maternal Medical History:  Reason for admission: Reason for admission: rupture of membranes.  Reason for Admission:   nauseaFetal activity: Perceived fetal activity is normal.    Prenatal complications: no prenatal complications   OB History    Grav Para Term Preterm Abortions TAB SAB Ect Mult Living   3 2 2  0 0 0 0 0 0 2     History reviewed. No pertinent past medical history. Past Surgical History  Procedure Date  . Breast reduction surgery 2006   Family History: family history includes Diabetes in her mother. Social History:  reports that she has never smoked. She has never used smokeless tobacco. She reports that she does not drink alcohol or use illicit drugs.  Review of Systems  Constitutional: Negative for fever.  Eyes: Negative for blurred vision.  Respiratory: Negative for shortness of breath.   Gastrointestinal: Negative for nausea and vomiting.  Skin: Negative for rash.  Neurological: Negative for headaches.      Blood pressure 105/62, pulse 94, temperature 98.3 F (36.8 C), temperature source Oral, resp. rate 20, height 5\' 2"  (1.575 m), weight 85.276 kg (188 lb), last menstrual period 02/15/2011. Maternal Exam:  Abdomen: Patient reports no abdominal tenderness. Fetal presentation: vertex  Introitus: not evaluated.     Physical Exam  Constitutional: She appears well-developed.  HENT:  Head: Normocephalic.  Neck: Neck supple. No thyromegaly present.  Cardiovascular: Normal rate and regular rhythm.   Respiratory: Breath sounds normal.  GI: Soft. Bowel sounds are normal.  Skin: No rash noted.    Prenatal labs: ABO, Rh: --/--/AB POS (04/25 1445) Antibody:   Rubella:   RPR:    HBsAg:    HIV:    GBS:     Assessment/Plan: 25 y.o. multipara @ [redacted]w[redacted]d w/PPROM. Not in labor. No signs/symptoms of overt chorioamnionitis.  Admit NST BID Bedrest Antibiotics Steroids U/S for  growth  Dana [name],Dana [name] 09/24/2011, 2:18 PM

## 2011-09-24 NOTE — Consult Note (Signed)
Neonatology Consult to Antenatal Patient:  Dana [name] is admitted today after PPROM at 41 5/[redacted] weeks GA. She is currently not having active labor. She is getting BMZ and IV antibiotics.  I spoke with the patient alone. We discussed the worst case of delivery in the next 1-2 days, including usual DR management, possible respiratory complications and need for support, IV access, feedings (mother desires bottle feeding), LOS, Mortality and Morbidity, and long term outcomes. She did not have any questions at this time. I offered a NICU tour to any interested family members and would be glad to come back if she has more questions later.  Thank you for asking me to see this patient.  Deatra James, MD Neonatologist  Time spent: [phone]

## 2011-09-25 NOTE — Plan of Care (Signed)
Problem: Consults Goal: Birthing Suites Patient Information Press F2 to bring up selections list  Outcome: Progressing  Pt < [redacted] weeks EGA     

## 2011-09-25 NOTE — Progress Notes (Signed)
  S: Preterm labor symptoms: None.  O: Blood pressure 106/64, pulse 97, temperature 98 F (36.7 C), temperature source Oral, resp. rate 20, height 5\' 2"  (1.575 m), weight 85.276 kg (188 lb), last menstrual period 02/15/2011.   ZOX:WRUEAVWU: 140 bpm, Variability: Good {> 6 bpm) and Accelerations: Reactive Toco: None SVE: Deferred.  A/P- 25 y.o. admitted with PPROM Preterm labor management: bedrest advised and continue antibiotics Dating:  [redacted]w[redacted]d FWB:  FHT c/w FWB

## 2011-09-26 NOTE — Progress Notes (Signed)
  S: Preterm labor symptoms: None  O: Blood pressure 106/55, pulse 95, temperature 97.9 F (36.6 C), temperature source Oral, resp. rate 18, height 5\' 2"  (1.575 m), weight 85.276 kg (188 lb), last menstrual period 02/15/2011.   QQV:ZDGLOVFI: 140 bpm, Variability: Good {> 6 bpm), Accelerations: Reactive and Decelerations: Absent Toco: None  A/P- 25 y.o. admitted with PPROM Dating:  [redacted]w[redacted]d  FWB:  FHT reassuring  Continue bedrest

## 2011-09-27 LAB — POCT PREGNANCY, URINE
Operator id: 117411
Preg Test, Ur: POSITIVE

## 2011-09-27 LAB — URINALYSIS, ROUTINE W REFLEX MICROSCOPIC
Bilirubin Urine: NEGATIVE
Glucose, UA: NEGATIVE
Protein, ur: NEGATIVE
Urobilinogen, UA: 0.2

## 2011-09-27 LAB — URINE MICROSCOPIC-ADD ON

## 2011-09-27 LAB — CBC
HCT: 36
Hemoglobin: 12.6
MCHC: 34.8
MCV: 82.8
Platelets: 268
RBC: 4.35
RDW: 13.7
WBC: 5.4

## 2011-09-27 LAB — GC/CHLAMYDIA PROBE AMP, GENITAL
Chlamydia, DNA Probe: POSITIVE — AB
GC Probe Amp, Genital: NEGATIVE

## 2011-09-27 LAB — WET PREP, GENITAL: Yeast Wet Prep HPF POC: NONE SEEN

## 2011-09-27 LAB — ABO/RH: ABO/RH(D): AB POS

## 2011-09-27 LAB — HCG, QUANTITATIVE, PREGNANCY

## 2011-09-27 NOTE — Progress Notes (Signed)
  S: Pt resting O: Blood pressure 106/57, pulse 96, temperature 97.7 F (36.5 C), temperature source Oral, resp. rate 18, height 5\' 2"  (1.575 m), weight 85.276 kg (188 lb), last menstrual period 02/15/2011.   DGU:YQIHKVQQ: 140 bpm, Variability: Good {> 6 bpm), Accelerations: Reactive and Decelerations: Absent Toco: None   A/P- 25 y.o. admitted with PPROM  Dating:  [redacted]w[redacted]d PNL: ? TDAP, flu vaccine Continue bedrest.

## 2011-09-27 NOTE — Progress Notes (Signed)
UR chart review completed.  

## 2011-09-27 NOTE — Progress Notes (Signed)
Patient calls out and states she walked back from the BR. Felt "hot flash", dizzy no nausea or vomiting. Room temp turned down to 73F, cool rag to face. Report given to Marrion Coy RN

## 2011-09-28 NOTE — Progress Notes (Signed)
  S: Resting comfortably.   O: Blood pressure 99/51, pulse 91, temperature 98.5 F (36.9 C), temperature source Oral, resp. rate 20, height 5\' 2"  (1.575 m), weight 85.276 kg (188 lb), last menstrual period 02/15/2011.   NWG:NFAOZHYQ: 140 bpm, Variability: Good {> 6 bpm) and Accelerations: Reactive Toco: None  A/P- 25 y.o. admitted with PPROM  Dating:  [redacted]w[redacted]d PNL Needed:  ?TDAP/flu vaccine Continue bedrest

## 2011-09-29 MED ORDER — INFLUENZA VIRUS VACC SPLIT PF IM SUSP
0.5000 mL | Freq: Once | INTRAMUSCULAR | Status: AC
  Administered 2011-09-29: 0.5 mL via INTRAMUSCULAR
  Filled 2011-09-29: qty 0.5

## 2011-09-29 MED ORDER — TETANUS-DIPHTH-ACELL PERTUSSIS 5-2.5-18.5 LF-MCG/0.5 IM SUSP
0.5000 mL | Freq: Once | INTRAMUSCULAR | Status: AC
  Administered 2011-09-29: 0.5 mL via INTRAMUSCULAR
  Filled 2011-09-29: qty 0.5

## 2011-09-29 NOTE — Progress Notes (Signed)
  S: Preterm labor symptoms: None  O: Blood pressure 120/64, pulse 97, temperature 98.2 F (36.8 C), temperature source Axillary, resp. rate 18, height 5\' 2"  (1.575 m), weight 73.619 kg (162 lb 4.8 oz), last menstrual period 02/15/2011.   ZOX:WRUEAVWU: 140 bpm, Variability: Good {> 6 bpm), Accelerations: Reactive and Decelerations: Absent Toco: None SVE: Deferred  A/P- 25 y.o. admitted with PPROM  Continue bedrest Dating:  [redacted]w[redacted]d PNL Needed:  TDAP, flu vaccine FWB:  FHT reassuring PTL:  Stable

## 2011-09-30 NOTE — Progress Notes (Addendum)
[redacted] weeks gestation, with PROM.  Height  62" Weight 162 Lbs pre-pregnancy weight unknown.Pre-pregnancy  BMI unknown  IBW 110 Lbs  Total weight gain unknown. Weight gain goals 15-25 Lbs.   Estimated needs: 18-2000 kcal/day, 80-90 grams protein/day, 2 - 2.5  liters fluid/day Regular diet tolerated well, appetite good. Noted 10/5 Hct of 27%, on PNV w/iron  Nutrition Dx: Increased nutrient needs r/t pregnancy and fetal growth requirements aeb [redacted] weeks gestation.  No educational needs assessed at this time.  Oriented pt to ordering meals for cafeteria to offer additional options Pt request snacks between meals. Will change diet order to Antenatal regular

## 2011-09-30 NOTE — Progress Notes (Signed)
S: Preterm labor symptoms: none  O: Blood pressure 100/82, pulse 115, temperature 98 F (36.7 C), temperature source Oral, resp. rate 18, height 5\' 2"  (1.575 m), weight 73.619 kg (162 lb 4.8 oz), last menstrual period 02/15/2011.   YNW:GNFAOZHY: 140 bpm, Variability: Good {> 6 bpm), Accelerations: Reactive and Decelerations: Absent Toco: None SVE: Deferred  A/P- 25 y.o. admitted with PPROM Preterm labor management: modified bedrest advised Dating:  [redacted]w[redacted]d  FWB:  FHT reassuring

## 2011-10-01 MED ORDER — POLYETHYLENE GLYCOL 3350 17 G PO PACK
17.0000 g | PACK | Freq: Every day | ORAL | Status: DC
  Administered 2011-10-01: 17 g via ORAL
  Filled 2011-10-01 (×3): qty 1

## 2011-10-01 NOTE — Progress Notes (Signed)
  S: Pt resting comfortably  O: Blood pressure 96/57, pulse 89, temperature 97.4 F (36.3 C), temperature source Axillary, resp. rate 20, height 5\' 2"  (1.575 m), weight 73.619 kg (162 lb 4.8 oz), last menstrual period 02/15/2011.   VWU:JWJXBJYN: 140 bpm, Variability: Good {> 6 bpm), Accelerations: Reactive and Decelerations: Absent Toco: None SVE: Deferred  A/P- 25 y.o. admitted with PPROM Preterm labor management: bedrest advised and pelvic rest advised Dating:  [redacted]w[redacted]d  FWB:  FHT reassuring PTL:  Stable

## 2011-10-02 ENCOUNTER — Encounter (HOSPITAL_COMMUNITY): Payer: Self-pay | Admitting: *Deleted

## 2011-10-02 LAB — CBC
MCH: 25.1 pg — ABNORMAL LOW (ref 26.0–34.0)
MCHC: 33.6 g/dL (ref 30.0–36.0)
Platelets: 215 10*3/uL (ref 150–400)
RBC: 4.1 MIL/uL (ref 3.87–5.11)

## 2011-10-02 LAB — RPR: RPR Ser Ql: NONREACTIVE

## 2011-10-02 MED ORDER — LACTATED RINGERS IV SOLN
INTRAVENOUS | Status: DC
  Administered 2011-10-02: 07:00:00 via INTRAVENOUS

## 2011-10-02 MED ORDER — EPHEDRINE 5 MG/ML INJ
10.0000 mg | INTRAVENOUS | Status: DC | PRN
  Filled 2011-10-02 (×2): qty 4

## 2011-10-02 MED ORDER — DEXTROSE 10 % NICU IV FLUID BOLUS: 3.0000 mL/kg | INJECTION | Freq: Once | INTRAVENOUS | Status: DC

## 2011-10-02 MED ORDER — ONDANSETRON HCL 4 MG/2ML IJ SOLN: 4.0000 mg | INTRAMUSCULAR | Status: DC | PRN

## 2011-10-02 MED ORDER — ACETAMINOPHEN 325 MG PO TABS: 650.0000 mg | ORAL_TABLET | ORAL | Status: DC | PRN

## 2011-10-02 MED ORDER — FLEET ENEMA 7-19 GM/118ML RE ENEM: 1.0000 | ENEMA | RECTAL | Status: DC | PRN

## 2011-10-02 MED ORDER — BENZOCAINE-MENTHOL 20-0.5 % EX AERO
1.0000 "application " | INHALATION_SPRAY | CUTANEOUS | Status: DC | PRN
  Administered 2011-10-03: 1 via TOPICAL

## 2011-10-02 MED ORDER — IBUPROFEN 600 MG PO TABS
600.0000 mg | ORAL_TABLET | Freq: Four times a day (QID) | ORAL | Status: DC
  Administered 2011-10-02 – 2011-10-04 (×7): 600 mg via ORAL
  Filled 2011-10-02 (×7): qty 1

## 2011-10-02 MED ORDER — OXYCODONE-ACETAMINOPHEN 5-325 MG PO TABS: 1.0000 | ORAL_TABLET | ORAL | Status: DC | PRN

## 2011-10-02 MED ORDER — ONDANSETRON HCL 4 MG PO TABS: 4.0000 mg | ORAL_TABLET | ORAL | Status: DC | PRN

## 2011-10-02 MED ORDER — LACTATED RINGERS IV SOLN: 500.0000 mL | INTRAVENOUS | Status: DC | PRN

## 2011-10-02 MED ORDER — SENNOSIDES-DOCUSATE SODIUM 8.6-50 MG PO TABS
2.0000 | ORAL_TABLET | Freq: Every day | ORAL | Status: DC
  Administered 2011-10-03 (×2): 2 via ORAL

## 2011-10-02 MED ORDER — PENICILLIN G POTASSIUM 5000000 UNITS IJ SOLR
5.0000 10*6.[IU] | Freq: Once | INTRAVENOUS | Status: DC
  Administered 2011-10-02: 5 10*6.[IU] via INTRAVENOUS
  Filled 2011-10-02: qty 5

## 2011-10-02 MED ORDER — EPHEDRINE 5 MG/ML INJ
10.0000 mg | INTRAVENOUS | Status: DC | PRN
  Filled 2011-10-02: qty 4

## 2011-10-02 MED ORDER — CITRIC ACID-SODIUM CITRATE 334-500 MG/5ML PO SOLN: 30.0000 mL | ORAL | Status: DC | PRN

## 2011-10-02 MED ORDER — IBUPROFEN 600 MG PO TABS
600.0000 mg | ORAL_TABLET | Freq: Four times a day (QID) | ORAL | Status: DC | PRN
  Administered 2011-10-02: 600 mg via ORAL
  Filled 2011-10-02: qty 1

## 2011-10-02 MED ORDER — TETANUS-DIPHTH-ACELL PERTUSSIS 5-2.5-18.5 LF-MCG/0.5 IM SUSP
0.5000 mL | Freq: Once | INTRAMUSCULAR | Status: DC
  Filled 2011-10-02: qty 0.5

## 2011-10-02 MED ORDER — MEASLES, MUMPS & RUBELLA VAC ~~LOC~~ INJ
0.5000 mL | INJECTION | Freq: Once | SUBCUTANEOUS | Status: AC
  Administered 2011-10-03: 0.5 mL via SUBCUTANEOUS
  Filled 2011-10-02: qty 0.5

## 2011-10-02 MED ORDER — DIPHENHYDRAMINE HCL 50 MG/ML IJ SOLN: 12.5000 mg | INTRAMUSCULAR | Status: DC | PRN

## 2011-10-02 MED ORDER — MEDROXYPROGESTERONE ACETATE 150 MG/ML IM SUSP: 150.0000 mg | INTRAMUSCULAR | Status: DC | PRN

## 2011-10-02 MED ORDER — LACTATED RINGERS IV SOLN: 500.0000 mL | Freq: Once | INTRAVENOUS | Status: DC

## 2011-10-02 MED ORDER — ZOLPIDEM TARTRATE 5 MG PO TABS
5.0000 mg | ORAL_TABLET | Freq: Every evening | ORAL | Status: DC | PRN
Start: 2011-10-02 — End: 2011-10-04

## 2011-10-02 MED ORDER — LANOLIN HYDROUS EX OINT: TOPICAL_OINTMENT | CUTANEOUS | Status: DC | PRN

## 2011-10-02 MED ORDER — MAGNESIUM HYDROXIDE 400 MG/5ML PO SUSP
30.0000 mL | ORAL | Status: DC | PRN
  Administered 2011-10-03: 30 mL via ORAL
  Filled 2011-10-02: qty 30

## 2011-10-02 MED ORDER — OXYTOCIN 20 UNITS IN LACTATED RINGERS INFUSION - SIMPLE: 500.0000 mL/h | Freq: Once | INTRAVENOUS | Status: DC

## 2011-10-02 MED ORDER — WITCH HAZEL-GLYCERIN EX PADS: 1.0000 "application " | MEDICATED_PAD | CUTANEOUS | Status: DC | PRN

## 2011-10-02 MED ORDER — DEXTROSE 5 % IV SOLN
5.0000 10*6.[IU] | Freq: Once | INTRAVENOUS | Status: DC
  Filled 2011-10-02: qty 5

## 2011-10-02 MED ORDER — FERROUS SULFATE 325 (65 FE) MG PO TABS
325.0000 mg | ORAL_TABLET | Freq: Two times a day (BID) | ORAL | Status: DC
  Administered 2011-10-03 (×2): 325 mg via ORAL
  Filled 2011-10-02 (×2): qty 1

## 2011-10-02 MED ORDER — DEXTROSE 5 % IV SOLN
2.5000 10*6.[IU] | INTRAVENOUS | Status: DC
  Filled 2011-10-02 (×5): qty 2.5

## 2011-10-02 MED ORDER — PENICILLIN G POTASSIUM 5000000 UNITS IJ SOLR
2.5000 10*6.[IU] | INTRAVENOUS | Status: DC
  Filled 2011-10-02 (×2): qty 2.5

## 2011-10-02 MED ORDER — PHENYLEPHRINE 40 MCG/ML (10ML) SYRINGE FOR IV PUSH (FOR BLOOD PRESSURE SUPPORT)
80.0000 ug | PREFILLED_SYRINGE | INTRAVENOUS | Status: DC | PRN
  Filled 2011-10-02 (×2): qty 5

## 2011-10-02 MED ORDER — PHENYLEPHRINE 40 MCG/ML (10ML) SYRINGE FOR IV PUSH (FOR BLOOD PRESSURE SUPPORT)
80.0000 ug | PREFILLED_SYRINGE | INTRAVENOUS | Status: DC | PRN
  Filled 2011-10-02: qty 5

## 2011-10-02 MED ORDER — DIPHENHYDRAMINE HCL 25 MG PO CAPS: 25.0000 mg | ORAL_CAPSULE | Freq: Four times a day (QID) | ORAL | Status: DC | PRN

## 2011-10-02 MED ORDER — OXYTOCIN 20 UNITS IN LACTATED RINGERS INFUSION - SIMPLE
INTRAVENOUS | Status: AC
  Administered 2011-10-02: 20 [IU]
  Filled 2011-10-02: qty 1000

## 2011-10-02 MED ORDER — DIBUCAINE 1 % RE OINT: 1.0000 "application " | TOPICAL_OINTMENT | RECTAL | Status: DC | PRN

## 2011-10-02 MED ORDER — ONDANSETRON HCL 4 MG/2ML IJ SOLN
4.0000 mg | Freq: Four times a day (QID) | INTRAMUSCULAR | Status: DC | PRN
  Administered 2011-10-02: 4 mg via INTRAVENOUS
  Filled 2011-10-02: qty 2

## 2011-10-02 MED ORDER — PRENATAL PLUS 27-1 MG PO TABS
1.0000 | ORAL_TABLET | Freq: Every day | ORAL | Status: DC
  Administered 2011-10-03: 1 via ORAL
  Filled 2011-10-02: qty 1

## 2011-10-02 MED ORDER — OXYCODONE-ACETAMINOPHEN 5-325 MG PO TABS
2.0000 | ORAL_TABLET | ORAL | Status: DC | PRN
  Administered 2011-10-02: 2 via ORAL
  Filled 2011-10-02: qty 2

## 2011-10-02 MED ORDER — FENTANYL 2.5 MCG/ML BUPIVACAINE 1/10 % EPIDURAL INFUSION (WH - ANES)
14.0000 mL/h | INTRAMUSCULAR | Status: DC
  Filled 2011-10-02: qty 60

## 2011-10-02 MED ORDER — LIDOCAINE HCL (PF) 1 % IJ SOLN
30.0000 mL | INTRAMUSCULAR | Status: DC | PRN
  Filled 2011-10-02 (×2): qty 30

## 2011-10-02 NOTE — Progress Notes (Signed)
Transferred to room 160/ birthing suites

## 2011-10-02 NOTE — Consult Note (Signed)
Called to attend precipitous labor and vaginal delivery of preterm infant @ 32.5 week female infant.  MOther is 25 y.o. multipara @ [redacted]w[redacted]d w/PPROM.  Mother received BMZ last week and by report had SROM ~ one week ago. No h/o maternal fever.   Arrived at time of infant's delivery. Immediate cries p delivery from vertex. Given tactile stim and bulb suction of naso/oropharynx. Very reactive with good tone and active spontaneous respirations. No dysmorphic features. Shown to mother and then transported to NICU with father accompanying.   Care to [facility]  Glade Strausser. Alphonsa Gin MD [facility]

## 2011-10-03 DIAGNOSIS — O99019 Anemia complicating pregnancy, unspecified trimester: Secondary | ICD-10-CM

## 2011-10-03 LAB — CBC
Hemoglobin: 8.6 g/dL — ABNORMAL LOW (ref 12.0–15.0)
MCH: 25.7 pg — ABNORMAL LOW (ref 26.0–34.0)
MCHC: 33.6 g/dL (ref 30.0–36.0)
Platelets: 227 10*3/uL (ref 150–400)
RDW: 14.4 % (ref 11.5–15.5)

## 2011-10-03 MED ORDER — BENZOCAINE-MENTHOL 20-0.5 % EX AERO
INHALATION_SPRAY | CUTANEOUS | Status: AC
  Administered 2011-10-03: 1 via TOPICAL
  Filled 2011-10-03: qty 56

## 2011-10-03 NOTE — Progress Notes (Signed)
PSYCHOSOCIAL ASSESSMENT ~ MATERNAL/CHILD Name:  Dana [name]        Age:  24 day   Referral Date:  10/02/2011   Reason/Source:  NICU admission/NICU I. FAMILY/HOME ENVIRONMENT A. Child's Legal Guardian Parent(s)  Name:  Dana [name]  DOB:  Jun 26, 1986   Age:  25 Address:  [address], [place], [place] [address], [place]  [phone](W)/[phone](V) Name:  Ronnald Ramp   Address : same  B. Other Household Members/Support Persons Name :  Sheria Lang       Relationship: Brother    Age 103        Name:   Cayson                 Relationship: Brother   Age 67         C.   Other Support:  Elisha Ponder Grandmother II. PSYCHOSOCIAL DATA A. Information Source X Patient Interview  X Family Interview            B. Event organiser X Employment  :  MO[employer], [employer] X [employer]: [place]       X Food Stamps     X WIC     X School: [employer], part time  III. STRENGTHS X Supportive family/friends  X Adequate Resources X Compliance with medical plan  X Home prepared for Child (including basic supplies)               X Understanding of illness            IV. RISK FACTORS AND CURRENT PROBLEMS       X No Problems Noted                      V. SOCIAL WORK ASSESSMENT Met with MOB at bedside to assess strengths and needs following Dana's NICU admission.  MOB reports good coping.  She is appropriately sad that Dana will not be going home with her when she is discharged.  She is understanding of her ability to visit Dana as desired during his stay and to participate in his care as she is able.  FOB is involved and helps.  MOB reports that her two older sons have visited the Dana and were excited to see him.  The brothers are appropriately curious about Dana, and I discussed transition home and normal sibling adjustment.  MOB attends [employer] part time, with plans to pursue the helping professions like social work.  She works at [employer] in [employer], and she is on Maternity leave  right now.  She plans to return to work around December or January depending on how Dana does post-discharge.  She also plans to return to school in the spring by taking 1 or 2 classes.  MOB feels she can manage her work, family, and school responsibilities as she does not intend to take on more than she can handle.  Mom has chosen to bottle feed her Dana.  Her older sons are in daycare are [facility].  She does not report having questions or needs at this time, but understands the CSW and care team are available as needed to assist her in any way we can.  MOB feels she is handling the situation well.  Her outlook and mood are positive and hopeful.  She very much looks forward to the time she can spend with Dana and enjoys nurturing and caring for her Dana.  She  has not chosen a name yet, due to Dana's early arrival, but the parents hope to have a name for Dana in the next day or two.  Mom understands she can reach [place] if needed for further assistance.    VI. SOCIAL WORK PLAN X No Further Intervention Required/No Barriers to Discharge X Patient/Family Education:  NICU brochure  Clinical Social Worker Signature:  Staci Acosta, LCSW      Date/Time:  10/03/2011, 6:02 pm

## 2011-10-03 NOTE — Progress Notes (Signed)
  Post Partum Day 1 S/P spontaneous vaginal RH status/Rubella reviewed.  Feeding: unknown Subjective: No HA, SOB, CP, F/C, breast symptoms. Normal vaginal bleeding, no clots.     Objective: BP 106/69  Pulse 108  Temp(Src) 98.3 F (36.8 C) (Oral)  Resp 16  Ht 5\' 2"  (1.575 m)  Wt 73.619 kg (162 lb 4.8 oz)  BMI 29.69 kg/m2  SpO2 99%  LMP 02/15/2011  Breastfeeding? Unknown   Physical Exam:  General: alert Lochia: appropriate Uterine Fundus: firm DVT Evaluation: No evidence of DVT seen on physical exam. Ext: No c/c/e  Basename 10/03/11 0504 10/02/11 0652  HGB 8.6* 10.3*  HCT 25.6* 30.7*      Assessment/Plan: 25 y.o.  PPD #1 .  normal postpartum exam Anemia stable Continue current postpartum care  Ambulate   LOS: 9 days   JACKSON-MOORE,Haroon Shatto A 10/03/2011, 11:04 AM

## 2011-10-03 NOTE — Plan of Care (Signed)
Problem: Consults Goal: Postpartum Patient Education (See Patient Education module for education specifics.) Outcome: Progressing Breast care Peri care Pain control When to call the MD dicussed  Problem: Phase I Progression Outcomes Goal: Pain controlled with appropriate interventions Outcome: Completed/Met Date Met:  10/03/11 Good pain control on ATC Motrin Goal: OOB as tolerated unless otherwise ordered Outcome: Completed/Met Date Met:  10/03/11 Walks frequently to NICU Goal: Initial discharge plan identified Outcome: Completed/Met Date Met:  10/03/11 Pain control Breast care when to call MD  Problem: Discharge Progression Outcomes Goal: Barriers To Progression Addressed/Resolved Outcome: Progressing Infant son in NICU

## 2011-10-04 MED ORDER — FERROUS SULFATE 325 (65 FE) MG PO TABS: 325.0000 mg | ORAL_TABLET | Freq: Two times a day (BID) | ORAL | Status: DC

## 2011-10-04 MED ORDER — FLEET ENEMA 7-19 GM/118ML RE ENEM
1.0000 | ENEMA | Freq: Every day | RECTAL | Status: DC | PRN
  Administered 2011-10-04: 1 via RECTAL

## 2011-10-04 MED ORDER — IBUPROFEN 600 MG PO TABS: 600.0000 mg | ORAL_TABLET | Freq: Four times a day (QID) | ORAL | Status: AC

## 2011-10-04 NOTE — Discharge Summary (Signed)
Obstetric Discharge Summary Reason for Admission: PPROM Prenatal Procedures: none Intrapartum Procedures: spontaneous vaginal delivery Postpartum Procedures: none Complications-Operative and Postpartum: none Hemoglobin  Date Value Range Status  10/03/2011 8.6* 12.0-15.0 (g/dL) Final     HCT  Date Value Range Status  10/03/2011 25.6* 36.0-46.0 (%) Final    Discharge Diagnoses: PPROM, Preterm delivery  Discharge Information: Date: 10/04/2011 Activity: pelvic rest Diet: routine Medications: PNV, Ibuprofen and Iron Condition: stable Instructions: see above Discharge to: home Follow-up Information    Follow up with Antionette Char A, MD. Call in 3 weeks.   Contact information:   [address], [address] [place] [address] [phone]          Newborn Data: Live born female  Birth Weight: 5 lb 6.5 oz (2451 g) APGAR: 8, 9  Home with NICU.  JACKSON-MOORE,Dana [name] A 10/04/2011, 8:31 AM

## 2011-10-04 NOTE — Progress Notes (Signed)
Spiritual Care - Provided support to NICU mom.  She reports baby is doing well and she is also with plans for discharge today.  She has a 15 & 24 year old at home.  Explained availability of chaplaincy services in the NICU as needed after D/C.  Dory Horn, Chaplain

## 2011-10-04 NOTE — Progress Notes (Signed)
Pt discharged to home with significant other. Condition stable.   Pt ambulated to car with NT.  No equipment ordered for home at discharge. 

## 2011-10-04 NOTE — Progress Notes (Signed)
Post Partum Day #2 S/P:spontaneous vaginal  RH status/Rubella reviewed.  Feeding: bottle Subjective: No HA, SOB, CP, F/C, breast symptoms: No. Normal vaginal bleeding, no clots.     Objective:  Blood pressure 93/57, pulse 88, temperature 98.4 F (36.9 C), temperature source Oral, resp. rate 16, height 5\' 2"  (1.575 m), weight 73.619 kg (162 lb 4.8 oz), last menstrual period 02/15/2011, SpO2 100.00%, unknown if currently breastfeeding.   Physical Exam:  General: alert Lochia: appropriate Uterine Fundus: firm DVT Evaluation: No evidence of DVT seen on physical exam. Ext: No c/c/e  Basename 10/03/11 0504 10/02/11 0652  HGB 8.6* 10.3*  HCT 25.6* 30.7*    Assessment/Plan: 25 y.o.  PPD # 2 .  normal postpartum exam Continue current postpartum care D/C home   LOS: 10 days   JACKSON-MOORE,Ilissa Rosner A 10/04/2011, 8:26 AM

## 2011-10-06 LAB — CBC
Hemoglobin: 10 — ABNORMAL LOW
MCV: 81.3
Platelets: 232
RBC: 4.35
RDW: 15.8 — ABNORMAL HIGH
WBC: 15.3 — ABNORMAL HIGH
WBC: 9.5

## 2011-10-06 LAB — RPR: RPR Ser Ql: NONREACTIVE

## 2012-08-05 ENCOUNTER — Encounter (HOSPITAL_COMMUNITY): Payer: Self-pay | Admitting: Obstetrics and Gynecology

## 2012-08-05 ENCOUNTER — Inpatient Hospital Stay (HOSPITAL_COMMUNITY)
Admission: AD | Admit: 2012-08-05 | Discharge: 2012-08-05 | Disposition: A | Discharge status: Home / Self Care | Payer: Self-pay | Source: Ambulatory Visit | Attending: Obstetrics & Gynecology | Admitting: Obstetrics & Gynecology

## 2012-08-05 DIAGNOSIS — IMO0002 Reserved for concepts with insufficient information to code with codable children: Secondary | ICD-10-CM | POA: Insufficient documentation

## 2012-08-05 DIAGNOSIS — Z30431 Encounter for routine checking of intrauterine contraceptive device: Secondary | ICD-10-CM | POA: Insufficient documentation

## 2012-08-05 DIAGNOSIS — R109 Unspecified abdominal pain: Secondary | ICD-10-CM | POA: Insufficient documentation

## 2012-08-05 DIAGNOSIS — N949 Unspecified condition associated with female genital organs and menstrual cycle: Secondary | ICD-10-CM | POA: Insufficient documentation

## 2012-08-05 DIAGNOSIS — R102 Pelvic and perineal pain: Secondary | ICD-10-CM

## 2012-08-05 LAB — URINALYSIS, ROUTINE W REFLEX MICROSCOPIC
Glucose, UA: NEGATIVE mg/dL
Leukocytes, UA: NEGATIVE
Protein, ur: NEGATIVE mg/dL
Specific Gravity, Urine: 1.015 (ref 1.005–1.030)
Urobilinogen, UA: 0.2 mg/dL (ref 0.0–1.0)

## 2012-08-05 LAB — POCT PREGNANCY, URINE: Preg Test, Ur: NEGATIVE

## 2012-08-05 MED ORDER — IBUPROFEN 600 MG PO TABS: 600.0000 mg | ORAL_TABLET | Freq: Four times a day (QID) | ORAL | Status: AC | PRN

## 2012-08-05 MED ORDER — IBUPROFEN 600 MG PO TABS
600.0000 mg | ORAL_TABLET | Freq: Once | ORAL | Status: AC
  Administered 2012-08-05: 600 mg via ORAL
  Filled 2012-08-05: qty 1

## 2012-08-05 MED ORDER — IBUPROFEN 600 MG PO TABS: 600.0000 mg | ORAL_TABLET | Freq: Four times a day (QID) | ORAL | Status: DC | PRN

## 2012-08-05 NOTE — Progress Notes (Signed)
Written and verbal d/c instructions given and understanding voiced. 

## 2012-08-05 NOTE — MAU Provider Note (Signed)
Dana [name] 20 Chief Complaint  Patient presents with  . Abdominal Cramping     First Provider Initiated Contact with Patient 08/05/12 2157      SUBJECTIVE  HPI: Has had suprapubic cramping intermittently since Mirena inserted in December. Wants a string check because she can't feel it, but is concerned that partner feels string during intercourse. Has mild but persistent dysparunia. Denies irritaive vaginal discharge, dysuria, urgency or frequency. Periods are very irregular and light. Wants work excuse.   History reviewed. No pertinent past medical history. Past Surgical History  Procedure Date  . Breast reduction surgery 2006   History   Social History  . Marital Status: Single    Spouse Name: N/A    Number of Children: N/A  . Years of Education: N/A   Occupational History  . Not on file.   Social History Main Topics  . Smoking status: Never Smoker   . Smokeless tobacco: Never Used  . Alcohol Use: No  . Drug Use: No  . Sexually Active: Yes    Birth Control/ Protection: IUD   Other Topics Concern  . Not on file   Social History Narrative  . No narrative on file   No current facility-administered medications on file prior to encounter.   No current outpatient prescriptions on file prior to encounter.   No Known Allergies  ROS: Pertinent items in HPI  OBJECTIVE Blood pressure 122/69, pulse 74, temperature 98.3 F (36.8 C), temperature source Oral, resp. rate 16, height 5\' 2"  (1.575 m), weight 74.571 kg (164 lb 6.4 oz), unknown if currently breastfeeding.  GENERAL: Well-developed, well-nourished female in no acute distress.  HEENT: Normocephalic, good dentition HEART: normal rate RESP: normal effort ABDOMEN: Soft, nontender EXTREMITIES: Nontender, no edema NEURO: Alert and oriented SPECULUM EXAM: NEFG, physiologic discharge, no blood noted, cervix clean, IUDstrings protrude about 1 cm from external os.  BIMANUAL: cervix multiparous,mobile,  NT; uterus NT, NSSP; no adnexal tenderness or masses   LAB RESULTS  Results for orders placed during the hospital encounter of 08/05/12 (from the past 24 hour(s))  URINALYSIS, ROUTINE W REFLEX MICROSCOPIC     Status: Abnormal   Collection Time   08/05/12  6:22 PM      Component Value Range   Color, Urine YELLOW  YELLOW   APPearance CLEAR  CLEAR   Specific Gravity, Urine 1.015  1.005 - 1.030   pH 5.5  5.0 - 8.0   Glucose, UA NEGATIVE  NEGATIVE mg/dL   Hgb urine dipstick NEGATIVE  NEGATIVE   Bilirubin Urine NEGATIVE  NEGATIVE   Ketones, ur 15 (*) NEGATIVE mg/dL   Protein, ur NEGATIVE  NEGATIVE mg/dL   Urobilinogen, UA 0.2  0.0 - 1.0 mg/dL   Nitrite NEGATIVE  NEGATIVE   Leukocytes, UA NEGATIVE  NEGATIVE  POCT PREGNANCY, URINE     Status: Normal   Collection Time   08/05/12  6:30 PM      Component Value Range   Preg Test, Ur NEGATIVE  NEGATIVE   MAU Course: ibuprofen 600mg  po given with resolution of cramping pain    ASSESSMENT  1. Pelvic pain in female   IUD strings short  PLAN  Rx ibuprofen 600mg  po q6h for cramps Reassured IUD in place and work excuse given.  Try different positions for intercourse if dysparunia.  F/U with [facility] if pain persists.      Sabella Traore 08/05/2012 10:03 PM

## 2012-08-05 NOTE — MAU Note (Signed)
Had nausea last night with some cramping thinks coming from her Mirena (put in last December) feels string

## 2012-08-05 NOTE — MAU Note (Signed)
"  I've had the Mirena since 11/2011.  I started cramping last night.  This morning it was extremely worse than period cramps.  I still have my periods with the Mirena.  It is somewhat regular.  Since I have had it, I've never experienced pain like this, so it scared me.  My LMP was 07/20/12.  I was spotting this morning when the pain was really bad.  I noticed it when I wiped."

## 2012-08-08 LAB — GC/CHLAMYDIA PROBE AMP, GENITAL: GC Probe Amp, Genital: NEGATIVE

## 2013-02-23 ENCOUNTER — Inpatient Hospital Stay (HOSPITAL_COMMUNITY)
Admission: AD | Admit: 2013-02-23 | Discharge: 2013-02-23 | Disposition: A | Discharge status: Home / Self Care | Payer: Self-pay | Source: Ambulatory Visit | Attending: Obstetrics & Gynecology | Admitting: Obstetrics & Gynecology

## 2013-02-23 ENCOUNTER — Encounter (HOSPITAL_COMMUNITY): Payer: Self-pay

## 2013-02-23 DIAGNOSIS — R112 Nausea with vomiting, unspecified: Secondary | ICD-10-CM | POA: Insufficient documentation

## 2013-02-23 DIAGNOSIS — N760 Acute vaginitis: Secondary | ICD-10-CM | POA: Insufficient documentation

## 2013-02-23 DIAGNOSIS — B9689 Other specified bacterial agents as the cause of diseases classified elsewhere: Secondary | ICD-10-CM | POA: Insufficient documentation

## 2013-02-23 DIAGNOSIS — A499 Bacterial infection, unspecified: Secondary | ICD-10-CM | POA: Insufficient documentation

## 2013-02-23 DIAGNOSIS — R109 Unspecified abdominal pain: Secondary | ICD-10-CM | POA: Insufficient documentation

## 2013-02-23 LAB — URINALYSIS, ROUTINE W REFLEX MICROSCOPIC
Bilirubin Urine: NEGATIVE
Glucose, UA: NEGATIVE mg/dL
Ketones, ur: NEGATIVE mg/dL
Protein, ur: NEGATIVE mg/dL
Urobilinogen, UA: 0.2 mg/dL (ref 0.0–1.0)

## 2013-02-23 LAB — URINE MICROSCOPIC-ADD ON

## 2013-02-23 LAB — WET PREP, GENITAL
Trich, Wet Prep: NONE SEEN
Yeast Wet Prep HPF POC: NONE SEEN

## 2013-02-23 MED ORDER — METRONIDAZOLE 500 MG PO TABS: 500.0000 mg | ORAL_TABLET | Freq: Two times a day (BID) | ORAL | Status: DC

## 2013-02-23 NOTE — MAU Note (Signed)
Onset of nausea, burning with urination, and abdominal pain since Wednesday having cramps, feeling dizzy, no vomiting today, LMP 01/26/13

## 2013-02-23 NOTE — MAU Provider Note (Signed)
History     CSN: [id number]  Arrival date and time: 02/23/13 1840   First Provider Initiated Contact with Patient 02/23/13 1950      Chief Complaint  Patient presents with  . Abdominal Pain  . Nausea  . Dysuria   HPI 27 y.o. [id number] with n/v, dysuria, low abd pain/cramping since Wednesday. Also c/o some vaginal/urethral burning/itching/irritation. Started period on Wednesday. Feeling a little better today, no vomiting, just nausea.    History reviewed. No pertinent past medical history.  Past Surgical History  Procedure Laterality Date  . Breast reduction surgery  2006    Family History  Problem Relation Age of Onset  . Diabetes Mother     History  Substance Use Topics  . Smoking status: Never Smoker   . Smokeless tobacco: Never Used  . Alcohol Use: No    Allergies: No Known Allergies  Prescriptions prior to admission  Medication Sig Dispense Refill  . Acetaminophen-Caff-Pyrilamine (MIDOL COMPLETE) 500-60-15 MG TABS Take 1-2 tablets by mouth every 6 (six) hours as needed (For menstrual cramps.).        Review of Systems  Constitutional: Negative.  Negative for fever and chills.  Respiratory: Negative.   Cardiovascular: Negative.   Gastrointestinal: Positive for nausea, vomiting and abdominal pain. Negative for diarrhea and constipation.  Genitourinary: Positive for dysuria. Negative for urgency, frequency, hematuria and flank pain.       Negative for vaginal bleeding, vaginal discharge, dyspareunia  Musculoskeletal: Negative.   Neurological: Positive for dizziness.  Psychiatric/Behavioral: Negative.    Physical Exam   Blood pressure 106/72, pulse 75, temperature 97.2 F (36.2 C), temperature source Oral, resp. rate 16, last menstrual period 01/26/2013.  Physical Exam  Nursing note and vitals reviewed. Constitutional: She is oriented to person, place, and time. She appears well-developed and well-nourished. No distress.  HENT:  Head: Normocephalic and  atraumatic.  Cardiovascular: Normal rate and regular rhythm.   Respiratory: Effort normal. No respiratory distress.  GI: Soft. She exhibits no distension and no mass. There is no tenderness. There is no rebound and no guarding.  Genitourinary: There is no rash or lesion on the right labia. There is no rash or lesion on the left labia. Uterus is not deviated, not enlarged, not fixed and not tender. Cervix exhibits no motion tenderness, no discharge and no friability. Right adnexum displays no mass, no tenderness and no fullness. Left adnexum displays no mass, no tenderness and no fullness. There is bleeding (small) around the vagina. No erythema or tenderness around the vagina. No vaginal discharge found.  Neurological: She is alert and oriented to person, place, and time.  Skin: Skin is warm and dry.  Psychiatric: She has a normal mood and affect.    MAU Course  Procedures  Results for orders placed during the hospital encounter of 02/23/13 (from the past 24 hour(s))  URINALYSIS, ROUTINE W REFLEX MICROSCOPIC     Status: Abnormal   Collection Time    02/23/13  6:50 PM      Result Value Range   Color, Urine YELLOW  YELLOW   APPearance CLEAR  CLEAR   Specific Gravity, Urine 1.020  1.005 - 1.030   pH 6.0  5.0 - 8.0   Glucose, UA NEGATIVE  NEGATIVE mg/dL   Hgb urine dipstick TRACE (*) NEGATIVE   Bilirubin Urine NEGATIVE  NEGATIVE   Ketones, ur NEGATIVE  NEGATIVE mg/dL   Protein, ur NEGATIVE  NEGATIVE mg/dL   Urobilinogen, UA 0.2  0.0 - 1.0  mg/dL   Nitrite NEGATIVE  NEGATIVE   Leukocytes, UA SMALL (*) NEGATIVE  URINE MICROSCOPIC-ADD ON     Status: Abnormal   Collection Time    02/23/13  6:50 PM      Result Value Range   Squamous Epithelial / LPF RARE  RARE   WBC, UA 11-20  <3 WBC/hpf   RBC / HPF 7-10  <3 RBC/hpf   Bacteria, UA FEW (*) RARE  POCT PREGNANCY, URINE     Status: None   Collection Time    02/23/13  7:14 PM      Result Value Range   Preg Test, Ur NEGATIVE  NEGATIVE  WET  PREP, GENITAL     Status: Abnormal   Collection Time    02/23/13  7:41 PM      Result Value Range   Yeast Wet Prep HPF POC NONE SEEN  NONE SEEN   Trich, Wet Prep NONE SEEN  NONE SEEN   Clue Cells Wet Prep HPF POC FEW (*) NONE SEEN   WBC, Wet Prep HPF POC FEW (*) NONE SEEN     Assessment and Plan   1. BV (bacterial vaginosis)       Medication List    TAKE these medications       metroNIDAZOLE 500 MG tablet  Commonly known as:  FLAGYL  Take 1 tablet (500 mg total) by mouth 2 (two) times daily.     MIDOL COMPLETE 500-60-15 MG Tabs  Generic drug:  Acetaminophen-Caff-Pyrilamine  Take 1-2 tablets by mouth every 6 (six) hours as needed (For menstrual cramps.).            Follow-up Information   Follow up with [facility]. (As needed)    Contact information:   [address] [place] [address] [phone]        [facility] 02/23/2013, 8:46 PM

## 2013-02-25 LAB — URINE CULTURE
Colony Count: NO GROWTH
Culture: NO GROWTH

## 2014-10-21 ENCOUNTER — Encounter (HOSPITAL_COMMUNITY): Payer: Self-pay

## 2016-04-08 ENCOUNTER — Ambulatory Visit (INDEPENDENT_AMBULATORY_CARE_PROVIDER_SITE_OTHER): Payer: BLUE CROSS/BLUE SHIELD | Admitting: Certified Nurse Midwife

## 2016-04-08 ENCOUNTER — Encounter: Payer: Self-pay | Admitting: Certified Nurse Midwife

## 2016-04-08 VITALS — BP 113/71 | HR 69 | Temp 98.4°F | Wt 197.0 lb

## 2016-04-08 DIAGNOSIS — Z30432 Encounter for removal of intrauterine contraceptive device: Secondary | ICD-10-CM | POA: Diagnosis not present

## 2016-04-08 NOTE — Progress Notes (Signed)
Patient ID: Dana ChewCasey N Berch, female   DOB: [birth year], 30 y.o.   MRN: [id number] [facility]   Reasons  for removal:  Is having severe lower abdominal pain, with sexual intercourse.  Is having irregular periods, had amenorrhea for years previously.     A timeout was performed confirming the patient, the procedure and allergy status. The patient was placed in the lithotomy position, a speculum was placed.  Cervix and strings visualized.   Long forceps used in a strile manner.  Stings grasped with long forceps and device removed intact.  The patient tolerated the procedure well.  New contraceptive method: planning Kyleena insertion on 04/09/16 and f/u annual exam with string check in 1 month.    R.Ascher Schroepfer CNM

## 2016-04-08 NOTE — Addendum Note (Signed)
Addended by: Marya LandryFOSTER, Jewelle Whitner D on: 04/08/2016 10:47 AM   Modules accepted: Orders

## 2016-04-09 ENCOUNTER — Ambulatory Visit (INDEPENDENT_AMBULATORY_CARE_PROVIDER_SITE_OTHER): Payer: BLUE CROSS/BLUE SHIELD | Admitting: Certified Nurse Midwife

## 2016-04-09 ENCOUNTER — Encounter: Payer: Self-pay | Admitting: Certified Nurse Midwife

## 2016-04-09 VITALS — BP 113/73 | HR 75

## 2016-04-09 DIAGNOSIS — Z30014 Encounter for initial prescription of intrauterine contraceptive device: Secondary | ICD-10-CM | POA: Diagnosis not present

## 2016-04-09 DIAGNOSIS — Z30430 Encounter for insertion of intrauterine contraceptive device: Secondary | ICD-10-CM | POA: Diagnosis not present

## 2016-04-09 NOTE — Progress Notes (Signed)
Patient ID: Dana ChewCasey N [name], female   DOB: [birth year], 30 y.o.   MRN: [id number]  IUD Procedure Note   DIAGNOSIS: Desires long-term, reversible contraception   PROCEDURE: IUD placement Performing Provider: Orvilla Cornwallachelle Seve Monette CNM  Patient counseled prior to procedure. I explained risks and benefits of Kyleena IUD, reviewed alternative forms of contraception. Patient stated understanding and consented to continue with procedure.   LMP: unknown, Mirena removed 04/08/16 Pregnancy Test: Negative Lot #: [id number] Expiration Date:  March 2018   IUD type: [   ] Mirena   [   ] Paraguard  [   ] Skyla   [X]   Kyleena  PROCEDURE:  Timeout procedure was performed to ensure right patient and right site.  A bimanual exam was performed to determine the position of the uterus, retroverted. The speculum was placed. The vagina and cervix was sterilized in the usual manner and sterile technique was maintained throughout the course of the procedure. A single toothed tenaculum was not required.  The depth of the uterus was sounded to 9 cm. With gentle traction on the tenaculum, the IUD was inserted to the appropriate depth and inserted without difficulty.  The string was cut to an estimated 4 cm length. Bleeding was minimal. The patient tolerated the procedure well.   Follow up: The patient tolerated the procedure well without complications.  Standard post-procedure care is explained and return precautions are given.  Orvilla Cornwallachelle Imojean Yoshino CNM

## 2016-04-11 LAB — NUSWAB VAGINITIS PLUS (VG+)
Atopobium vaginae: HIGH Score — AB
BVAB 2: HIGH Score — AB
CANDIDA ALBICANS, NAA: NEGATIVE
CANDIDA GLABRATA, NAA: NEGATIVE
Chlamydia trachomatis, NAA: NEGATIVE
Neisseria gonorrhoeae, NAA: NEGATIVE
TRICH VAG BY NAA: NEGATIVE

## 2016-04-14 ENCOUNTER — Other Ambulatory Visit: Payer: Self-pay | Admitting: Certified Nurse Midwife

## 2016-04-14 DIAGNOSIS — B9689 Other specified bacterial agents as the cause of diseases classified elsewhere: Secondary | ICD-10-CM

## 2016-04-14 DIAGNOSIS — N760 Acute vaginitis: Principal | ICD-10-CM

## 2016-04-14 MED ORDER — TINIDAZOLE 500 MG PO TABS: 2.0000 g | ORAL_TABLET | Freq: Every day | ORAL | Status: AC

## 2016-04-20 ENCOUNTER — Encounter: Payer: Self-pay | Admitting: *Deleted

## 2016-05-07 ENCOUNTER — Encounter: Payer: Self-pay | Admitting: Certified Nurse Midwife

## 2016-05-07 ENCOUNTER — Ambulatory Visit (INDEPENDENT_AMBULATORY_CARE_PROVIDER_SITE_OTHER): Payer: BLUE CROSS/BLUE SHIELD | Admitting: Certified Nurse Midwife

## 2016-05-07 VITALS — BP 107/72 | HR 72 | Wt 194.0 lb

## 2016-05-07 DIAGNOSIS — Z01419 Encounter for gynecological examination (general) (routine) without abnormal findings: Secondary | ICD-10-CM | POA: Diagnosis not present

## 2016-05-07 DIAGNOSIS — Z113 Encounter for screening for infections with a predominantly sexual mode of transmission: Secondary | ICD-10-CM

## 2016-05-07 NOTE — Progress Notes (Signed)
Patient ID: Dana [name], female   DOB: 1986-05-08, 30 y.o.   MRN: [id number]    Subjective:      Dana [name] is a 30 y.o. female here for a routine exam.  Current complaints: none.  1 day of spotting since IUD insertion.  Desires full std screening exam today.    Personal health questionnaire:  Is patient Ashkenazi Jewish, have a family history of breast and/or ovarian cancer: no Is there a family history of uterine cancer diagnosed at age < 4150, gastrointestinal cancer, urinary tract cancer, family member who is a Personnel officerLynch syndrome-associated carrier: no Is the patient overweight and hypertensive, family history of diabetes, personal history of gestational diabetes, preeclampsia or PCOS: yes Is patient over 5655, have PCOS,  family history of premature CHD under age 565, diabetes, smoke, have hypertension or peripheral artery disease:  yes At any time, has a partner hit, kicked or otherwise hurt or frightened you?: no Over the past 2 weeks, have you felt down, depressed or hopeless?: no Over the past 2 weeks, have you felt little interest or pleasure in doing things?:no   Gynecologic History No LMP recorded. Contraception: IUD Last Pap: 2016. Results were: abnormal according to the patient and needed colpo Last mammogram: N/A.   Obstetric History OB History  Gravida Para Term Preterm AB SAB TAB Ectopic Multiple Living  3 3 2 1  0 0 0 0 0 3    # Outcome Date GA Lbr Len/2nd Weight Sex Delivery Anes PTL Lv  3 Preterm 10/02/11 [id number] 04:45  M Vag-Spont None    2 Term           1 Term               No past medical history on file.  Past Surgical History  Procedure Laterality Date  . Breast reduction surgery  2006     Current outpatient prescriptions:  .  Acetaminophen-Caff-Pyrilamine (MIDOL COMPLETE) 500-60-15 MG TABS, Take 1-2 tablets by mouth every 6 (six) hours as needed (For menstrual cramps.)., Disp: , Rfl:  No Known Allergies  Social History  Substance Use Topics  . Smoking  status: Never Smoker   . Smokeless tobacco: Never Used  . Alcohol Use: No    Family History  Problem Relation Age of Onset  . Diabetes Mother       Review of Systems  Constitutional: negative for fatigue and weight loss Respiratory: negative for cough and wheezing Cardiovascular: negative for chest pain, fatigue and palpitations Gastrointestinal: negative for abdominal pain and change in bowel habits Musculoskeletal:negative for myalgias Neurological: negative for gait problems and tremors Behavioral/Psych: negative for abusive relationship, depression Endocrine: negative for temperature intolerance   Genitourinary:negative for abnormal menstrual periods, genital lesions, hot flashes, sexual problems and vaginal discharge Integument/breast: negative for breast lump, breast tenderness, nipple discharge and skin lesion(s)    Objective:       BP 107/72 mmHg  Pulse 72  Wt 194 lb (87.998 kg) General:   alert  Skin:   no rash or abnormalities  Lungs:   clear to auscultation bilaterally  Heart:   regular rate and rhythm, S1, S2 normal, no murmur, click, rub or gallop  Breasts:   normal without suspicious masses, skin or nipple changes or axillary nodes  Abdomen:  normal findings: no organomegaly, soft, non-tender and no hernia  Pelvis:  External genitalia: normal general appearance Urinary system: urethral meatus normal and bladder without fullness, nontender Vaginal: normal without tenderness, induration or  masses Cervix: normal appearance, +IUD strings visualized Adnexa: normal bimanual exam Uterus: anteverted and non-tender, normal size   Lab Review Urine pregnancy test Labs reviewed yes Radiologic studies reviewed no  50% of 30 min visit spent on counseling and coordination of care.   Assessment:    Healthy female exam.   STD screening exam  IUD: string present  Plan:    Education reviewed: calcium supplements, depression evaluation, low fat, low cholesterol  diet, safe sex/STD prevention, self breast exams, skin cancer screening and weight bearing exercise. Contraception: IUD. Follow up in: 1 year.   No orders of the defined types were placed in this encounter.   Orders Placed This Encounter  Procedures  . Hepatitis B surface antigen  . RPR  . Hepatitis C antibody  . HIV antibody   Need to obtain previous records

## 2016-05-08 LAB — HEPATITIS B SURFACE ANTIGEN: Hepatitis B Surface Ag: NEGATIVE

## 2016-05-08 LAB — HIV ANTIBODY (ROUTINE TESTING W REFLEX): HIV Screen 4th Generation wRfx: NONREACTIVE

## 2016-05-08 LAB — HEPATITIS C ANTIBODY

## 2016-05-08 LAB — RPR: RPR Ser Ql: NONREACTIVE

## 2016-05-10 LAB — PAP IG W/ RFLX HPV ASCU: PAP SMEAR COMMENT: 0

## 2018-07-17 ENCOUNTER — Ambulatory Visit: Payer: Self-pay | Admitting: Nurse Practitioner

## 2018-10-05 ENCOUNTER — Ambulatory Visit: Payer: Self-pay | Admitting: Psychiatry

## 2019-10-31 ENCOUNTER — Other Ambulatory Visit: Payer: Self-pay

## 2019-10-31 DIAGNOSIS — Z20822 Contact with and (suspected) exposure to covid-19: Secondary | ICD-10-CM

## 2019-11-02 LAB — NOVEL CORONAVIRUS, NAA: SARS-CoV-2, NAA: NOT DETECTED

## 2020-01-03 ENCOUNTER — Other Ambulatory Visit: Payer: Self-pay

## 2020-02-08 ENCOUNTER — Other Ambulatory Visit: Payer: Self-pay

## 2020-02-08 ENCOUNTER — Ambulatory Visit (INDEPENDENT_AMBULATORY_CARE_PROVIDER_SITE_OTHER): Payer: PRIVATE HEALTH INSURANCE | Admitting: Medical

## 2020-02-08 ENCOUNTER — Encounter: Payer: Self-pay | Admitting: Medical

## 2020-02-08 VITALS — BP 109/73 | HR 76 | Temp 97.9°F | Ht 63.0 in | Wt 201.4 lb

## 2020-02-08 DIAGNOSIS — B9689 Other specified bacterial agents as the cause of diseases classified elsewhere: Secondary | ICD-10-CM | POA: Diagnosis not present

## 2020-02-08 DIAGNOSIS — N760 Acute vaginitis: Secondary | ICD-10-CM

## 2020-02-08 DIAGNOSIS — Z113 Encounter for screening for infections with a predominantly sexual mode of transmission: Secondary | ICD-10-CM

## 2020-02-08 DIAGNOSIS — Z3042 Encounter for surveillance of injectable contraceptive: Secondary | ICD-10-CM

## 2020-02-08 DIAGNOSIS — Z01419 Encounter for gynecological examination (general) (routine) without abnormal findings: Secondary | ICD-10-CM

## 2020-02-08 DIAGNOSIS — N898 Other specified noninflammatory disorders of vagina: Secondary | ICD-10-CM

## 2020-02-08 DIAGNOSIS — Z124 Encounter for screening for malignant neoplasm of cervix: Secondary | ICD-10-CM | POA: Diagnosis not present

## 2020-02-08 DIAGNOSIS — Z1151 Encounter for screening for human papillomavirus (HPV): Secondary | ICD-10-CM | POA: Diagnosis not present

## 2020-02-08 MED ORDER — CALCIUM CARBONATE-VITAMIN D 500-200 MG-UNIT PO TABS: 1.0000 | ORAL_TABLET | Freq: Two times a day (BID) | ORAL | 11 refills | Status: DC

## 2020-02-08 MED ORDER — MEDROXYPROGESTERONE ACETATE 150 MG/ML IM SUSP
150.0000 mg | Freq: Once | INTRAMUSCULAR | Status: AC
  Administered 2020-02-08: 12:00:00 150 mg via INTRAMUSCULAR

## 2020-02-08 MED ORDER — MEDROXYPROGESTERONE ACETATE 150 MG/ML IM SUSP: 150.0000 mg | INTRAMUSCULAR | 3 refills | Status: DC

## 2020-02-08 NOTE — Progress Notes (Signed)
IUD removal  Starting Depo  Calcium dexa in 5 years   History:  [name] is a 34 y.o. [id number] who presents to clinic today for annual exam with pap smear and STD testing. She would also like to have IUD removed. IUD was placed after her last child in 2017. She has noted increased cramping over the last year and feels that it is causing some hormonal changes. She would like to go back to the Depo Provera which she has used before. She would also like STD testing today as well as testing for BV. She had BV with her last pregnancy and believes she has had it a few times since then as well.    The following portions of the patient's history were reviewed and updated as appropriate: allergies, current medications, family history, past medical history, social history, past surgical history and problem list.  Review of Systems:  Review of Systems  Constitutional: Negative for fever.  Gastrointestinal: Positive for abdominal pain.  Genitourinary: Negative for dysuria, frequency and urgency.       Neg - vaginal bleeding, discharge      Objective:  Physical Exam BP 109/73   Pulse 76   Temp 97.9 F (36.6 C)   Ht 5\' 3"  (1.6 m)   Wt 201 lb 6.4 oz (91.4 kg)   BMI 35.68 kg/m  Physical Exam  Nursing note and vitals reviewed. Constitutional: She is oriented to person, place, and time. She appears well-developed and well-nourished. No distress.  HENT:  Head: Normocephalic and atraumatic.  Cardiovascular: Normal rate, regular rhythm and normal heart sounds.  No murmur heard. Respiratory: Effort normal and breath sounds normal. No respiratory distress. She has no wheezes.  GI: Soft. Bowel sounds are normal. She exhibits no distension and no mass. There is no abdominal tenderness. There is no rebound and no guarding.  Genitourinary: Cervix exhibits friability. Cervix exhibits no motion tenderness and no discharge.    Vaginal discharge (mucous) and bleeding (scant following pap ) present.   There is bleeding (scant following pap ) in the vagina.  Neurological: She is alert and oriented to person, place, and time.  Skin: Skin is warm and dry. No erythema.  Psychiatric: She has a normal mood and affect.    GYNECOLOGY CLINIC PROCEDURE NOTE  IUD Removal  Patient was in the dorsal lithotomy position, normal external genitalia was noted.  A speculum was placed in the patient's vagina, normal discharge was noted, no lesions. The multiparous cervix was visualized, no lesions, no abnormal discharge.  The strings of the IUD were grasped and pulled using ring forceps. The IUD was removed in its entirety.Patient tolerated the procedure well.    Patient will use Depo Provera for contraception.   Assessment & Plan:  1. Well woman exam with routine gynecological exam - Cytology - PAP( [facility]) - Cervicovaginal ancillary only( [place]) - HIV antibody (with reflex) - RPR - Hepatitis C Antibody - Hepatitis B surface antigen  2. IUD removal   3. Unwanted fertility - IUD removed today at patient's request - Depo provera given and will continue q 3 months  - Calcium + Vitamin D sent to patient's pharmacy   Return to [facility] in 3 months for depo provera or sooner PRN  , PA-C 02/08/2020 11:54 AM

## 2020-02-08 NOTE — Progress Notes (Addendum)
New GYN presents for AEX/PAP/STD testing.  C/o Rutha Bouchard IUD is making her hormonal and wants it removed.  She wants to go back on DEPO.   PHQ-9=8.  Depo given in RD, tolerated well Next DEPO May 7-21/2021  Administrations This Visit    medroxyPROGESTERone (DEPO-PROVERA) injection 150 mg    Admin Date 02/08/2020 Action Given Dose 150 mg Route Intramuscular Administered By Maretta Bees, RMA

## 2020-02-09 LAB — RPR: RPR Ser Ql: NONREACTIVE

## 2020-02-09 LAB — HEPATITIS C ANTIBODY: Hep C Virus Ab: <0.1 s/co ratio (ref 0.0–0.9)

## 2020-02-09 LAB — HIV ANTIBODY (ROUTINE TESTING W REFLEX): HIV Screen 4th Generation wRfx: NONREACTIVE

## 2020-02-09 LAB — HEPATITIS B SURFACE ANTIGEN: Hepatitis B Surface Ag: NEGATIVE

## 2020-02-11 LAB — CERVICOVAGINAL ANCILLARY ONLY
Bacterial Vaginitis (gardnerella): POSITIVE — AB
Candida Glabrata: NEGATIVE
Candida Vaginitis: NEGATIVE
Chlamydia: NEGATIVE
Comment: NEGATIVE
Comment: NEGATIVE
Comment: NEGATIVE
Comment: NEGATIVE
Comment: NEGATIVE
Comment: NORMAL
Neisseria Gonorrhea: NEGATIVE
Trichomonas: NEGATIVE

## 2020-02-12 MED ORDER — METRONIDAZOLE 500 MG PO TABS: 500.0000 mg | ORAL_TABLET | Freq: Two times a day (BID) | ORAL | 0 refills | Status: DC

## 2020-02-12 NOTE — Addendum Note (Signed)
Addended by: Marny Lowenstein on: 02/12/2020 08:32 AM   Modules accepted: Orders

## 2020-02-13 LAB — CYTOLOGY - PAP
Comment: NEGATIVE
Diagnosis: NEGATIVE
High risk HPV: NEGATIVE

## 2020-04-29 ENCOUNTER — Ambulatory Visit: Payer: PRIVATE HEALTH INSURANCE

## 2020-05-01 ENCOUNTER — Ambulatory Visit: Payer: PRIVATE HEALTH INSURANCE

## 2020-05-08 ENCOUNTER — Other Ambulatory Visit: Payer: Self-pay

## 2020-05-08 ENCOUNTER — Ambulatory Visit (HOSPITAL_BASED_OUTPATIENT_CLINIC_OR_DEPARTMENT_OTHER): Payer: Self-pay

## 2020-05-08 VITALS — BP 97/67 | HR 73 | Ht 62.0 in | Wt 207.8 lb

## 2020-05-08 DIAGNOSIS — Z3042 Encounter for surveillance of injectable contraceptive: Secondary | ICD-10-CM

## 2020-05-08 DIAGNOSIS — Z30013 Encounter for initial prescription of injectable contraceptive: Secondary | ICD-10-CM

## 2020-05-08 MED ORDER — MEDROXYPROGESTERONE ACETATE 150 MG/ML IM SUSP
150.0000 mg | Freq: Once | INTRAMUSCULAR | Status: AC
  Administered 2020-05-08: 150 mg via INTRAMUSCULAR

## 2020-05-08 NOTE — Progress Notes (Signed)
GYN presents for DEPO, given in LD, tolerated well.  Next DEPO  August 5-19, 2021  Administrations This Visit    medroxyPROGESTERone (DEPO-PROVERA) injection 150 mg    Admin Date 05/08/2020 Action Given Dose 150 mg Route Intramuscular Administered By Maretta Bees, RMA

## 2020-05-09 NOTE — Progress Notes (Signed)
Patient ID: Dana [name], female   DOB: 1986/03/10, 34 y.o.   MRN: [id number] Patient was assessed and managed by nursing staff during this encounter. I have reviewed the chart and agree with the documentation and plan. I have also made any necessary editorial changes.  Scheryl Darter, MD 05/09/2020 11:09 AM

## 2020-10-01 DIAGNOSIS — Z23 Encounter for immunization: Secondary | ICD-10-CM | POA: Diagnosis not present

## 2021-01-07 DIAGNOSIS — J988 Other specified respiratory disorders: Secondary | ICD-10-CM | POA: Diagnosis not present

## 2021-02-24 ENCOUNTER — Other Ambulatory Visit: Payer: Self-pay | Admitting: Medical

## 2021-03-12 ENCOUNTER — Other Ambulatory Visit: Payer: Self-pay

## 2021-03-12 ENCOUNTER — Ambulatory Visit (INDEPENDENT_AMBULATORY_CARE_PROVIDER_SITE_OTHER): Payer: BC Managed Care – PPO | Admitting: Obstetrics and Gynecology

## 2021-03-12 ENCOUNTER — Encounter: Payer: Self-pay | Admitting: Obstetrics and Gynecology

## 2021-03-12 VITALS — BP 118/87 | HR 72 | Ht 63.5 in | Wt 205.0 lb

## 2021-03-12 DIAGNOSIS — Z30013 Encounter for initial prescription of injectable contraceptive: Secondary | ICD-10-CM

## 2021-03-12 DIAGNOSIS — N912 Amenorrhea, unspecified: Secondary | ICD-10-CM | POA: Diagnosis not present

## 2021-03-12 DIAGNOSIS — Z13220 Encounter for screening for lipoid disorders: Secondary | ICD-10-CM

## 2021-03-12 DIAGNOSIS — Z01419 Encounter for gynecological examination (general) (routine) without abnormal findings: Secondary | ICD-10-CM

## 2021-03-12 DIAGNOSIS — Z131 Encounter for screening for diabetes mellitus: Secondary | ICD-10-CM | POA: Diagnosis not present

## 2021-03-12 LAB — POCT URINE PREGNANCY: Preg Test, Ur: NEGATIVE

## 2021-03-12 MED ORDER — MEDROXYPROGESTERONE ACETATE 150 MG/ML IM SUSP
150.0000 mg | Freq: Once | INTRAMUSCULAR | Status: AC
  Administered 2021-03-12: 150 mg via INTRAMUSCULAR

## 2021-03-12 NOTE — Progress Notes (Signed)
   History:  [name] is a 35 y.o. [id number] who presents to clinic today for gyn appointment.  No concerns overall. Desires restarting depo. Had taken a break off of it and had IUD. This was removed in 2021. She restarted depo but missed it for past two months. Had negative UPT today. She is sexually active and monogamous with one partner. Declines STD testing today. Reports having menstrual cycle q3 months on depo. Happy with this overall. Does not plan pregnancy in the next year. Has three prior children born by vaginal delivery.   Last pap 2021 NILM neg HPV.    The following portions of the patient's history were reviewed and updated as appropriate: allergies, current medications, family history, past medical history, social history, past surgical history and problem list.  Review of Systems:  Review of Systems  Constitutional: Negative for chills and fever.  Cardiovascular: Negative for chest pain.  Gastrointestinal: Negative for abdominal pain, constipation and diarrhea.  Genitourinary: Negative for dysuria and urgency.  Musculoskeletal: Negative for back pain.  Neurological: Negative for dizziness and headaches.      Objective:  Physical Exam BP 118/87   Pulse 72   Ht 5' 3.5" (1.613 m)   Wt 205 lb (93 kg)   LMP 02/21/2021 (Approximate)   BMI 35.74 kg/m  Physical Exam Vitals and nursing note reviewed.  Constitutional:      Appearance: Normal appearance.  HENT:     Head: Atraumatic.  Cardiovascular:     Rate and Rhythm: Normal rate and regular rhythm.  Pulmonary:     Effort: Pulmonary effort is normal.     Breath sounds: Normal breath sounds.  Chest:  Breasts:     Right: Normal. No mass.     Left: Normal. No mass.      Comments: Hx of breast reduction surgery, well healed scars present. nontender to palpation  Abdominal:     General: Abdomen is flat. Bowel sounds are normal. There is no distension.     Palpations: Abdomen is soft. There is no mass.      Tenderness: There is no abdominal tenderness.     Hernia: No hernia is present.  Skin:    General: Skin is warm and dry.  Neurological:     Mental Status: She is alert.  Psychiatric:        Mood and Affect: Mood normal.        Behavior: Behavior normal.       Labs and Imaging Results for orders placed or performed in visit on 03/12/21 (from the past 24 hour(s))  POCT urine pregnancy     Status: Normal   Collection Time: 03/12/21 11:00 AM  Result Value Ref Range   Preg Test, Ur Negative Negative    No results found.   Assessment & Plan:  1. Encounter for annual routine gynecological examination Declines STD testing today. Last pap 2021 NILM neg HPV. Due in five years.   2. Screening for diabetes mellitus - Hemoglobin A1c  3. Screening cholesterol level - Lipid panel  4. Amenorrhea - POCT urine pregnancy  5. Encounter for initial prescription of injectable contraceptive -depo administered  - medroxyPROGESTERone (DEPO-PROVERA) injection 150 mg   Gita Kudo, MD 03/12/2021 11:16 AM  Casper Harrison, MD OB Family Medicine Fellow, [facility] for [facility], [facility]

## 2021-03-12 NOTE — Progress Notes (Addendum)
GYN presents for AEX.  She wants to restart DEPO, last injection was last year.  Last unprotected sex was 1 week ago.  UPT today is NEGATIVE  Last PAP 02/08/2020.  Per Provider it is ok to give DEPO Injection today.  Patient supplied DEPO Injection given RUOQ, tolerated well.  Next DEPO due June 9-23, 2022  Administrations This Visit    medroxyPROGESTERone (DEPO-PROVERA) injection 150 mg    Admin Date 03/12/2021 Action Given Dose 150 mg Route Intramuscular Administered By Maretta Bees, RMA

## 2021-03-13 LAB — LIPID PANEL
Chol/HDL Ratio: 2.9 ratio (ref 0.0–4.4)
Cholesterol, Total: 134 mg/dL (ref 100–199)
HDL: 47 mg/dL (ref >39)
LDL Chol Calc (NIH): 77 mg/dL (ref 0–99)
Triglycerides: 40 mg/dL (ref 0–149)
VLDL Cholesterol Cal: 10 mg/dL (ref 5–40)

## 2021-03-13 LAB — HEMOGLOBIN A1C
Est. average glucose Bld gHb Est-mCnc: 103 mg/dL
Hgb A1c MFr Bld: 5.2 % (ref 4.8–5.6)

## 2021-07-01 ENCOUNTER — Other Ambulatory Visit: Payer: Self-pay

## 2021-07-01 ENCOUNTER — Ambulatory Visit (INDEPENDENT_AMBULATORY_CARE_PROVIDER_SITE_OTHER): Payer: BC Managed Care – PPO

## 2021-07-01 DIAGNOSIS — Z308 Encounter for other contraceptive management: Secondary | ICD-10-CM

## 2021-07-01 DIAGNOSIS — Z309 Encounter for contraceptive management, unspecified: Secondary | ICD-10-CM

## 2021-07-01 LAB — POCT URINE PREGNANCY: Preg Test, Ur: NEGATIVE

## 2021-07-01 MED ORDER — MEDROXYPROGESTERONE ACETATE 150 MG/ML IM SUSP: 150.0000 mg | INTRAMUSCULAR | 3 refills | Status: DC

## 2021-07-01 NOTE — Progress Notes (Signed)
SUBJECTIVE: Dana [name] is a 35 y.o. female who presents for 1st. UPT for DEPO restart.  OBJECTIVE: Appears well, in no apparent distress.  Restart Depo BC.  ASSESSMENT: NEGATIVE Pregnancy Test. LMP  05/18/2021.  PLAN:  Abstain from sex for 2 weeks Pick up DEPO Rx and return in 2 weeks for 2nd. UPT/DEPO Injection Medroxyprogesterone Acetate 150 mg Rx

## 2021-07-03 NOTE — Progress Notes (Signed)
Chart reviewed for nurse visit. Agree with plan of care.   Venora Maples, MD 07/03/21 11:26 AM

## 2021-07-15 ENCOUNTER — Ambulatory Visit (INDEPENDENT_AMBULATORY_CARE_PROVIDER_SITE_OTHER): Payer: BC Managed Care – PPO

## 2021-07-15 ENCOUNTER — Other Ambulatory Visit: Payer: Self-pay

## 2021-07-15 VITALS — BP 104/68 | HR 72 | Ht 63.0 in | Wt 207.0 lb

## 2021-07-15 DIAGNOSIS — Z3042 Encounter for surveillance of injectable contraceptive: Secondary | ICD-10-CM

## 2021-07-15 LAB — POCT URINE PREGNANCY: Preg Test, Ur: NEGATIVE

## 2021-07-15 MED ORDER — MEDROXYPROGESTERONE ACETATE 150 MG/ML IM SUSP
150.0000 mg | Freq: Once | INTRAMUSCULAR | Status: AC
  Administered 2021-07-15: 150 mg via INTRAMUSCULAR

## 2021-07-15 NOTE — Progress Notes (Signed)
SUBJECTIVE: Dana [name] is a 35 y.o. female who presents for DEPO Injection restart.  OBJECTIVE: Appears well, in no apparent distress.  Vital signs are normal.  ASSESSMENT: 2nd. UPT today is NEGATIVE.    Last PAP 02/08/2020  PLAN: DEPO given in RD, tolerated well.   Next DEPO due Oct. 12-26, 2022  Administrations This Visit     medroxyPROGESTERone (DEPO-PROVERA) injection 150 mg     Admin Date 07/15/2021 Action Given Dose 150 mg Route Intramuscular Administered By Maretta Bees, RMA

## 2021-07-16 NOTE — Progress Notes (Signed)
Agree with A & P. 

## 2021-11-04 DIAGNOSIS — Z111 Encounter for screening for respiratory tuberculosis: Secondary | ICD-10-CM | POA: Diagnosis not present

## 2021-11-10 DIAGNOSIS — Z131 Encounter for screening for diabetes mellitus: Secondary | ICD-10-CM | POA: Diagnosis not present

## 2021-11-10 DIAGNOSIS — Z111 Encounter for screening for respiratory tuberculosis: Secondary | ICD-10-CM | POA: Diagnosis not present

## 2021-11-10 DIAGNOSIS — Z0000 Encounter for general adult medical examination without abnormal findings: Secondary | ICD-10-CM | POA: Diagnosis not present

## 2021-11-13 DIAGNOSIS — Z111 Encounter for screening for respiratory tuberculosis: Secondary | ICD-10-CM | POA: Diagnosis not present

## 2021-11-26 ENCOUNTER — Other Ambulatory Visit: Payer: Self-pay

## 2021-11-26 ENCOUNTER — Ambulatory Visit (INDEPENDENT_AMBULATORY_CARE_PROVIDER_SITE_OTHER): Payer: BC Managed Care – PPO | Admitting: *Deleted

## 2021-11-26 VITALS — BP 119/78 | HR 69

## 2021-11-26 DIAGNOSIS — Z3042 Encounter for surveillance of injectable contraceptive: Secondary | ICD-10-CM

## 2021-11-26 LAB — POCT URINE PREGNANCY: Preg Test, Ur: NEGATIVE

## 2021-11-26 NOTE — Progress Notes (Signed)
Dana [name] presents for 1st UPT to restart Depo. No unprotected sex in the last 2 weeks. UPT is negative. Patient to schedule appointment in 2 weeks for 2nd UPT and Depo restart. Patient advised no unprotected sex for the next 2 weeks. Patient has adequate refills for next 3 doses. Advised to schedule annual with March Depo.

## 2021-12-10 ENCOUNTER — Other Ambulatory Visit: Payer: Self-pay

## 2021-12-10 ENCOUNTER — Ambulatory Visit (INDEPENDENT_AMBULATORY_CARE_PROVIDER_SITE_OTHER): Payer: BC Managed Care – PPO

## 2021-12-10 VITALS — BP 121/77 | HR 76 | Ht 64.0 in | Wt 200.0 lb

## 2021-12-10 DIAGNOSIS — Z3042 Encounter for surveillance of injectable contraceptive: Secondary | ICD-10-CM

## 2021-12-10 LAB — POCT URINE PREGNANCY: Preg Test, Ur: NEGATIVE

## 2021-12-10 MED ORDER — MEDROXYPROGESTERONE ACETATE 150 MG/ML IM SUSP
150.0000 mg | Freq: Once | INTRAMUSCULAR | Status: AC
  Administered 2021-12-10: 13:00:00 150 mg via INTRAMUSCULAR

## 2021-12-10 NOTE — Progress Notes (Signed)
Pt presents today for 2nd UPT and Depo Provera restart. UPT in office today is negative.   Depo Provera 150mg  given IM RUOQ. Pt tolerated well with no adverse side effects noted. Pt to return to office between 02/25/22-03/11/22 for repeat injection. Pt is up to date for yearly exam, due 02/2022.

## 2022-03-19 ENCOUNTER — Ambulatory Visit: Payer: BC Managed Care – PPO

## 2022-03-23 ENCOUNTER — Ambulatory Visit (INDEPENDENT_AMBULATORY_CARE_PROVIDER_SITE_OTHER): Payer: BC Managed Care – PPO | Admitting: *Deleted

## 2022-03-23 DIAGNOSIS — Z3042 Encounter for surveillance of injectable contraceptive: Secondary | ICD-10-CM | POA: Diagnosis not present

## 2022-03-23 LAB — POCT URINE PREGNANCY: Preg Test, Ur: NEGATIVE

## 2022-03-23 NOTE — Progress Notes (Signed)
Pt here for 1st UPT to restart Depo. Pt denies unprotected sex in the last 2 weeks. UPT negative. Scheduled to come 04/02/22 for 2nd UPT and if negative restart Depo. Has RX with refills at pharmacy. Pt instructed to avoid unprotected sex until restart appt. Pt instructed to pick up RX prior to appt. ?

## 2022-04-02 ENCOUNTER — Ambulatory Visit (INDEPENDENT_AMBULATORY_CARE_PROVIDER_SITE_OTHER): Payer: BC Managed Care – PPO | Admitting: Emergency Medicine

## 2022-04-02 ENCOUNTER — Other Ambulatory Visit (HOSPITAL_COMMUNITY)
Admission: RE | Admit: 2022-04-02 | Discharge: 2022-04-02 | Disposition: A | Discharge status: Home / Self Care | Payer: BC Managed Care – PPO | Source: Ambulatory Visit | Attending: Obstetrics and Gynecology | Admitting: Obstetrics and Gynecology

## 2022-04-02 VITALS — BP 136/68 | HR 76 | Wt 195.0 lb

## 2022-04-02 DIAGNOSIS — N760 Acute vaginitis: Secondary | ICD-10-CM | POA: Diagnosis not present

## 2022-04-02 DIAGNOSIS — Z3042 Encounter for surveillance of injectable contraceptive: Secondary | ICD-10-CM | POA: Diagnosis not present

## 2022-04-02 DIAGNOSIS — N898 Other specified noninflammatory disorders of vagina: Secondary | ICD-10-CM

## 2022-04-02 LAB — POCT URINE PREGNANCY: Preg Test, Ur: NEGATIVE

## 2022-04-02 MED ORDER — MEDROXYPROGESTERONE ACETATE 150 MG/ML IM SUSP
150.0000 mg | Freq: Once | INTRAMUSCULAR | Status: AC
  Administered 2022-04-02: 150 mg via INTRAMUSCULAR

## 2022-04-02 NOTE — Progress Notes (Signed)
Patient was assessed and managed by nursing staff during this encounter. I have reviewed the chart and agree with the documentation and plan. I have also made any necessary editorial changes. ? ?Warden Fillers, MD ?04/02/2022 6:10 PM   ?

## 2022-04-02 NOTE — Progress Notes (Signed)
Date last pap: 02/08/20. ?Last Depo-Provera: 12/10/21- . ?Side Effects if any: NONE. ?Serum HCG indicated? Yes- negative today, 2nd UPT. ?Depo-Provera 150 mg IM given by: Resa Miner, RUOQ, tolerated well, no adverse side effects. ?Next appointment due Jun 30-Jul 14.  ? ?Patient reports vaginal discharge and odor and requests swab self-collection. ?

## 2022-04-05 LAB — CERVICOVAGINAL ANCILLARY ONLY
Bacterial Vaginitis (gardnerella): POSITIVE — AB
Candida Glabrata: NEGATIVE
Candida Vaginitis: NEGATIVE
Chlamydia: NEGATIVE
Comment: NEGATIVE
Comment: NEGATIVE
Comment: NEGATIVE
Comment: NEGATIVE
Comment: NEGATIVE
Comment: NORMAL
Neisseria Gonorrhea: NEGATIVE
Trichomonas: NEGATIVE

## 2022-04-06 ENCOUNTER — Telehealth: Payer: Self-pay | Admitting: Emergency Medicine

## 2022-04-06 ENCOUNTER — Encounter: Payer: Self-pay | Admitting: Emergency Medicine

## 2022-04-06 DIAGNOSIS — B9689 Other specified bacterial agents as the cause of diseases classified elsewhere: Secondary | ICD-10-CM

## 2022-04-06 MED ORDER — METRONIDAZOLE 500 MG PO TABS: 500.0000 mg | ORAL_TABLET | Freq: Two times a day (BID) | ORAL | 0 refills | Status: DC

## 2022-04-06 NOTE — Telephone Encounter (Signed)
Attempted call to discuss results and Rx.  ?Rx sent to pharmacy on file.  ?Mychart message sent.  ?

## 2022-04-06 NOTE — Telephone Encounter (Signed)
Attempt call to discuss results and Rx.Unable to leave vm. My chart message sent. Rx sent to pharmacy. ?

## 2022-06-28 ENCOUNTER — Ambulatory Visit (INDEPENDENT_AMBULATORY_CARE_PROVIDER_SITE_OTHER): Payer: BC Managed Care – PPO

## 2022-06-28 VITALS — BP 108/73 | HR 74 | Ht 64.0 in | Wt 198.0 lb

## 2022-06-28 DIAGNOSIS — Z3042 Encounter for surveillance of injectable contraceptive: Secondary | ICD-10-CM

## 2022-06-28 MED ORDER — MEDROXYPROGESTERONE ACETATE 150 MG/ML IM SUSP
150.0000 mg | Freq: Once | INTRAMUSCULAR | Status: AC
  Administered 2022-06-28: 150 mg via INTRAMUSCULAR

## 2022-06-28 NOTE — Progress Notes (Signed)
SUBJECTIVE: Dana [name] is a 36 y.o. female who presents for DEPO Injection BC.   OBJECTIVE: Appears well, in no apparent distress.  Vital signs are normal.     ASSESSMENT: Need for BC.  Last PAP 02/08/2020  PLAN: DEPO Injection given in [facility], tolerated well.   Next DEPO due Sept. 25 - Oct. 9, 2023    Administrations This Visit     medroxyPROGESTERone (DEPO-PROVERA) injection 150 mg     Admin Date 06/28/2022 Action Given Dose 150 mg Route Intramuscular Administered By Maretta Bees, RMA

## 2022-07-13 NOTE — Progress Notes (Signed)
Patient was assessed and managed by nursing staff during this encounter. I have reviewed the chart and agree with the documentation and plan. I have also made any necessary editorial changes.  Perline Awe, MD 07/13/2022 2:55 PM   

## 2022-09-09 ENCOUNTER — Telehealth: Payer: Self-pay

## 2022-09-15 ENCOUNTER — Other Ambulatory Visit: Payer: Self-pay | Admitting: Obstetrics

## 2022-09-15 ENCOUNTER — Ambulatory Visit: Payer: BC Managed Care – PPO | Admitting: Student

## 2022-09-15 ENCOUNTER — Other Ambulatory Visit: Payer: Self-pay

## 2022-09-15 DIAGNOSIS — Z309 Encounter for contraceptive management, unspecified: Secondary | ICD-10-CM

## 2022-09-15 MED ORDER — MEDROXYPROGESTERONE ACETATE 150 MG/ML IM SUSP: 150.0000 mg | INTRAMUSCULAR | 0 refills | Status: DC

## 2022-09-17 ENCOUNTER — Ambulatory Visit (INDEPENDENT_AMBULATORY_CARE_PROVIDER_SITE_OTHER): Payer: BC Managed Care – PPO | Admitting: General Practice

## 2022-09-17 DIAGNOSIS — Z3042 Encounter for surveillance of injectable contraceptive: Secondary | ICD-10-CM | POA: Diagnosis not present

## 2022-09-17 MED ORDER — MEDROXYPROGESTERONE ACETATE 150 MG/ML IM SUSP
150.0000 mg | Freq: Once | INTRAMUSCULAR | Status: AC
  Administered 2022-09-17: 150 mg via INTRAMUSCULAR

## 2022-09-17 NOTE — Progress Notes (Signed)
Date last pap: 02-08-20. Last Depo-Provera: 06-28-22. Side Effects if any: Pt tolerated well. Depo-Provera 150 mg IM given by: Arlie Solomons, CMA in [facility] per pt request. Next appointment due 12/15-12/29.

## 2022-09-17 NOTE — Progress Notes (Signed)
Agree with nurses's documentation of this patient's clinic encounter.  Kiril Hippe L, MD  

## 2022-10-20 ENCOUNTER — Other Ambulatory Visit (HOSPITAL_COMMUNITY)
Admission: RE | Admit: 2022-10-20 | Discharge: 2022-10-20 | Disposition: A | Discharge status: Home / Self Care | Payer: BC Managed Care – PPO | Source: Ambulatory Visit | Attending: Obstetrics and Gynecology | Admitting: Obstetrics and Gynecology

## 2022-10-20 ENCOUNTER — Ambulatory Visit: Payer: BC Managed Care – PPO

## 2022-10-20 ENCOUNTER — Ambulatory Visit (INDEPENDENT_AMBULATORY_CARE_PROVIDER_SITE_OTHER): Payer: BC Managed Care – PPO | Admitting: General Practice

## 2022-10-20 VITALS — BP 135/87 | HR 83 | Ht 64.0 in | Wt 197.6 lb

## 2022-10-20 DIAGNOSIS — N898 Other specified noninflammatory disorders of vagina: Secondary | ICD-10-CM | POA: Insufficient documentation

## 2022-10-20 MED ORDER — FLUCONAZOLE 150 MG PO TABS: 150.0000 mg | ORAL_TABLET | Freq: Once | ORAL | 0 refills | Status: AC

## 2022-10-20 NOTE — Progress Notes (Signed)
SUBJECTIVE:  36 y.o. female complains of white and curd-like vaginal discharge for 3 day(s). Denies abnormal vaginal bleeding or significant pelvic pain or fever. No UTI symptoms. Denies history of known exposure to STD.  No LMP recorded.  OBJECTIVE:  She appears well, afebrile. Urine dipstick: not done.  ASSESSMENT:  Vaginal Discharge  Vaginal Odor   PLAN:  GC, chlamydia, trichomonas, BVAG, CVAG probe sent to lab. Treatment: Diflucan sent per protocol for symptoms. Further treatment to be determined once lab results are received ROV prn if symptoms persist or worsen.

## 2022-10-20 NOTE — Progress Notes (Signed)
Patient was assessed and managed by nursing staff during this encounter. I have reviewed the chart and agree with the documentation and plan. I have also made any necessary editorial changes.  Griffin Basil, MD 10/20/2022 11:28 AM

## 2022-10-21 LAB — CERVICOVAGINAL ANCILLARY ONLY
Bacterial Vaginitis (gardnerella): POSITIVE — AB
Candida Glabrata: NEGATIVE
Candida Vaginitis: NEGATIVE
Chlamydia: NEGATIVE
Comment: NEGATIVE
Comment: NEGATIVE
Comment: NEGATIVE
Comment: NEGATIVE
Comment: NEGATIVE
Comment: NORMAL
Neisseria Gonorrhea: NEGATIVE
Trichomonas: NEGATIVE

## 2022-10-22 ENCOUNTER — Other Ambulatory Visit: Payer: Self-pay

## 2022-10-22 DIAGNOSIS — B9689 Other specified bacterial agents as the cause of diseases classified elsewhere: Secondary | ICD-10-CM

## 2022-10-22 MED ORDER — METRONIDAZOLE 500 MG PO TABS: 500.0000 mg | ORAL_TABLET | Freq: Two times a day (BID) | ORAL | 0 refills | Status: DC

## 2022-11-10 ENCOUNTER — Ambulatory Visit: Payer: BC Managed Care – PPO

## 2022-11-24 ENCOUNTER — Ambulatory Visit: Payer: BC Managed Care – PPO | Admitting: Student

## 2022-12-10 ENCOUNTER — Ambulatory Visit: Payer: BC Managed Care – PPO

## 2023-01-28 ENCOUNTER — Ambulatory Visit: Payer: BLUE CROSS/BLUE SHIELD | Admitting: Obstetrics and Gynecology

## 2023-03-22 ENCOUNTER — Ambulatory Visit: Payer: BLUE CROSS/BLUE SHIELD | Admitting: Certified Nurse Midwife

## 2023-03-22 DIAGNOSIS — Z3042 Encounter for surveillance of injectable contraceptive: Secondary | ICD-10-CM

## 2023-03-22 DIAGNOSIS — Z3009 Encounter for other general counseling and advice on contraception: Secondary | ICD-10-CM

## 2023-03-22 DIAGNOSIS — Z01419 Encounter for gynecological examination (general) (routine) without abnormal findings: Secondary | ICD-10-CM

## 2023-03-22 DIAGNOSIS — Z113 Encounter for screening for infections with a predominantly sexual mode of transmission: Secondary | ICD-10-CM

## 2023-03-22 NOTE — Progress Notes (Signed)
Patient did not show for this appointment today.   Amalea Ottey Isaias Sakai) Rollene Rotunda, MSN, [place] for [facility]  03/22/23 12:45 PM

## 2023-08-22 ENCOUNTER — Encounter (HOSPITAL_COMMUNITY): Payer: Self-pay | Admitting: *Deleted

## 2023-08-22 ENCOUNTER — Inpatient Hospital Stay (HOSPITAL_COMMUNITY)
Admission: AD | Admit: 2023-08-22 | Discharge: 2023-08-22 | Disposition: A | Discharge status: Home / Self Care | Payer: Self-pay | Attending: Obstetrics and Gynecology | Admitting: Obstetrics and Gynecology

## 2023-08-22 ENCOUNTER — Inpatient Hospital Stay (HOSPITAL_COMMUNITY): Payer: Self-pay

## 2023-08-22 DIAGNOSIS — O209 Hemorrhage in early pregnancy, unspecified: Secondary | ICD-10-CM

## 2023-08-22 DIAGNOSIS — Z3A01 Less than 8 weeks gestation of pregnancy: Secondary | ICD-10-CM

## 2023-08-22 DIAGNOSIS — Z3201 Encounter for pregnancy test, result positive: Secondary | ICD-10-CM | POA: Insufficient documentation

## 2023-08-22 DIAGNOSIS — O418X10 Other specified disorders of amniotic fluid and membranes, first trimester, not applicable or unspecified: Secondary | ICD-10-CM

## 2023-08-22 LAB — WET PREP, GENITAL
Sperm: NONE SEEN
Trich, Wet Prep: NONE SEEN
WBC, Wet Prep HPF POC: >=10 — AB (ref <10)
Yeast Wet Prep HPF POC: NONE SEEN

## 2023-08-22 LAB — POCT PREGNANCY, URINE: Preg Test, Ur: POSITIVE — AB

## 2023-08-22 LAB — HCG, QUANTITATIVE, PREGNANCY: hCG, Beta Chain, Quant, S: 3895 m[IU]/mL — ABNORMAL HIGH (ref <5)

## 2023-08-22 NOTE — MAU Note (Signed)
Dana [name] is a 37 y.o. at Unknown here in MAU reporting: period was late.  +HPT on Friday.  Was extremely nauseous.  Started cramping and bleeding this morning while at work.  Pain stopped at noon, just spotting now.  Then started having hot flashes. LMP: 7/27 Onset of complaint: this morning Pain score: none Vitals:   08/22/23 1623  BP: 115/70  Pulse: 67  Resp: 16  Temp: 98.6 F (37 C)  SpO2: 100%      Lab orders placed from triage:  UPT

## 2023-08-22 NOTE — MAU Provider Note (Signed)
None     Chief Complaint:  Vaginal Bleeding, Abdominal Pain, and Possible Pregnancy   Dana [name] is  37 y.o. [phone] at Unknown presents complaining of Vaginal Bleeding, Abdominal Pain, and Possible Pregnancy .   Per RN:   period was late.  +HPT on Friday.  Was extremely nauseous.  Started cramping and bleeding this morning while at work.  Pain stopped at noon, just spotting now.  Then started having hot flashes. LMP: 7/27 Onset of complaint: this morning Pain score: none    Pt states that bleeding was when wiping and some in underclothes at most, now just on q tip when collected swab.  Cramping "like I'm going to start my period".   Obstetrical/Gynecological History: OB History     Gravida  4   Para  3   Term  2   Preterm  1   AB  0   Living  3      SAB  0   IAB  0   Ectopic  0   Multiple  0   Live Births  2        Obstetric Comments  PPROM #3        Past Medical History: Past Medical History:  Diagnosis Date   Preterm labor     Past Surgical History: Past Surgical History:  Procedure Laterality Date   BREAST REDUCTION SURGERY  2006    Family History: Family History  Problem Relation Age of Onset   Diabetes Mother        11/2022 diabetic coma   Healthy Father     Social History: Social History   Tobacco Use   Smoking status: Former    Types: Cigarettes   Smokeless tobacco: Never   Tobacco comments:    Quit smoking 2019  Vaping Use   Vaping status: Every Day  Substance Use Topics   Alcohol use: No   Drug use: No    Allergies: No Known Allergies  Meds:  Medications Prior to Admission  Medication Sig Dispense Refill Last Dose   metroNIDAZOLE (FLAGYL) 500 MG tablet Take 1 tablet (500 mg total) by mouth 2 (two) times daily. 14 tablet 0    calcium-vitamin D (OSCAL WITH D) 500-200 MG-UNIT tablet Take 1 tablet by mouth 2 (two) times daily. 60 tablet 11    medroxyPROGESTERone (DEPO-PROVERA) 150 MG/ML injection Inject 1 mL  (150 mg total) into the muscle every 3 (three) months. 1 mL 3    medroxyPROGESTERone (DEPO-PROVERA) 150 MG/ML injection Inject 1 mL (150 mg total) into the muscle every 3 (three) months. 1 mL 0    metroNIDAZOLE (FLAGYL) 500 MG tablet Take 1 tablet (500 mg total) by mouth 2 (two) times daily. (Patient not taking: Reported on 09/17/2022) 14 tablet 0     Review of Systems   Constitutional: Negative for fever and chills Eyes: Negative for visual disturbances Respiratory: Negative for shortness of breath, dyspnea Cardiovascular: Negative for chest pain or palpitations  Gastrointestinal: Negative for vomiting, diarrhea and constipation Genitourinary: Negative for dysuria and urgency Musculoskeletal: Negative for back pain, joint pain, myalgias.  Normal ROM  Neurological: Negative for dizziness and headaches    Physical Exam  Blood pressure 115/70, pulse 67, temperature 98.6 F (37 C), temperature source Oral, resp. rate 16, height 5\' 3"  (1.6 m), weight 85.5 kg, last menstrual period 07/16/2023, SpO2 100%. GENERAL: Well-developed, well-nourished female in no acute distress.  LUNGS: Normal respiratory effort HEART: Regular rate and rhythm. ABDOMEN: Soft, nontender,  nondistended, gravid.  EXTREMITIES: Nontender, no edema, 2+ distal pulses. DTR's 2+    Labs: Results for orders placed or performed during the hospital encounter of 08/22/23 (from the past 24 hour(s))  Pregnancy, urine POC   Collection Time: 08/22/23  4:38 PM  Result Value Ref Range   Preg Test, Ur POSITIVE (A) NEGATIVE  Wet prep, genital   Collection Time: 08/22/23  4:41 PM   Specimen: PATH Cytology Cervicovaginal Ancillary Only  Result Value Ref Range   Yeast Wet Prep HPF POC NONE SEEN NONE SEEN   Trich, Wet Prep NONE SEEN NONE SEEN   Clue Cells Wet Prep HPF POC PRESENT (A) NONE SEEN   WBC, Wet Prep HPF POC >=10 (A) <10   Sperm NONE SEEN   hCG, quantitative, pregnancy   Collection Time: 08/22/23  4:53 PM  Result  Value Ref Range   hCG, Beta Chain, Quant, S 3,895 (H) <5 mIU/mL   Imaging Studies:    Assessment: Dana [name] is  37 y.o. [phone] @ 5.2 weeks presents with small SCH, stable.  Plan: DC home, Has appt w/OB later this week  Jacklyn Shell 9/2/20248:28 PM

## 2023-08-25 LAB — GC/CHLAMYDIA PROBE AMP (~~LOC~~) NOT AT ARMC
Chlamydia: NEGATIVE
Comment: NEGATIVE
Comment: NORMAL
Neisseria Gonorrhea: NEGATIVE

## 2023-08-29 ENCOUNTER — Other Ambulatory Visit: Payer: Self-pay

## 2023-08-29 ENCOUNTER — Ambulatory Visit: Payer: BLUE CROSS/BLUE SHIELD

## 2023-08-29 DIAGNOSIS — Z3490 Encounter for supervision of normal pregnancy, unspecified, unspecified trimester: Secondary | ICD-10-CM

## 2023-08-30 ENCOUNTER — Inpatient Hospital Stay (HOSPITAL_COMMUNITY)
Admission: AD | Admit: 2023-08-30 | Discharge: 2023-08-30 | Disposition: A | Discharge status: Home / Self Care | Payer: Self-pay | Attending: Obstetrics & Gynecology | Admitting: Obstetrics & Gynecology

## 2023-08-30 ENCOUNTER — Inpatient Hospital Stay (HOSPITAL_COMMUNITY): Payer: Self-pay

## 2023-08-30 DIAGNOSIS — B9689 Other specified bacterial agents as the cause of diseases classified elsewhere: Secondary | ICD-10-CM | POA: Insufficient documentation

## 2023-08-30 DIAGNOSIS — O26891 Other specified pregnancy related conditions, first trimester: Secondary | ICD-10-CM | POA: Insufficient documentation

## 2023-08-30 DIAGNOSIS — O23591 Infection of other part of genital tract in pregnancy, first trimester: Secondary | ICD-10-CM | POA: Insufficient documentation

## 2023-08-30 DIAGNOSIS — O209 Hemorrhage in early pregnancy, unspecified: Secondary | ICD-10-CM

## 2023-08-30 DIAGNOSIS — N760 Acute vaginitis: Secondary | ICD-10-CM

## 2023-08-30 DIAGNOSIS — O208 Other hemorrhage in early pregnancy: Secondary | ICD-10-CM | POA: Insufficient documentation

## 2023-08-30 DIAGNOSIS — Z3A01 Less than 8 weeks gestation of pregnancy: Secondary | ICD-10-CM | POA: Insufficient documentation

## 2023-08-30 LAB — CBC
HCT: 38 % (ref 36.0–46.0)
Hemoglobin: 12.7 g/dL (ref 12.0–15.0)
MCH: 28.5 pg (ref 26.0–34.0)
MCHC: 33.4 g/dL (ref 30.0–36.0)
MCV: 85.2 fL (ref 80.0–100.0)
Platelets: 171 10*3/uL (ref 150–400)
RBC: 4.46 MIL/uL (ref 3.87–5.11)
RDW: 13.1 % (ref 11.5–15.5)
WBC: 3.4 10*3/uL — ABNORMAL LOW (ref 4.0–10.5)
nRBC: 0 % (ref 0.0–0.2)

## 2023-08-30 LAB — WET PREP, GENITAL
Sperm: NONE SEEN
Trich, Wet Prep: NONE SEEN
WBC, Wet Prep HPF POC: <10 (ref <10)
Yeast Wet Prep HPF POC: NONE SEEN

## 2023-08-30 LAB — HCG, QUANTITATIVE, PREGNANCY: hCG, Beta Chain, Quant, S: 11523 m[IU]/mL — ABNORMAL HIGH (ref <5)

## 2023-08-30 LAB — ABO/RH: ABO/RH(D): AB POS

## 2023-08-30 MED ORDER — KETOROLAC TROMETHAMINE 60 MG/2ML IM SOLN
30.0000 mg | Freq: Once | INTRAMUSCULAR | Status: AC
  Administered 2023-08-30: 30 mg via INTRAMUSCULAR
  Filled 2023-08-30: qty 2

## 2023-08-30 MED ORDER — METRONIDAZOLE 500 MG PO TABS: 500.0000 mg | ORAL_TABLET | Freq: Two times a day (BID) | ORAL | 0 refills | Status: DC

## 2023-08-30 NOTE — MAU Provider Note (Cosign Needed Addendum)
History     CSN: [id number]  Arrival date and time: 08/30/23 1301   None     Chief Complaint  Patient presents with   Vaginal Bleeding   Abdominal Pain   HPI Dana [name] is 37 y.o. [id number] at [redacted]w[redacted]d who presents for vaginal bleeding. She was seen 9/2 for spotting and cramping. She had a U/S which showed an early IUGS but no FP visualized, hCG 3895 - given f/up appt for viability scan 9/16. She returns today for ongoing bleeding and cramping. States this had improved some but then became worse over the past couple of days. Reports "gushing feeling" and has been changing pads every other hour, but has not been actually saturating pads. No fevers, chills, passage of clots/tissue.  Past Medical History:  Diagnosis Date   Preterm labor     Past Surgical History:  Procedure Laterality Date   BREAST REDUCTION SURGERY  2006    Family History  Problem Relation Age of Onset   Diabetes Mother        11/2022 diabetic coma   Healthy Father     Social History   Tobacco Use   Smoking status: Former    Types: Cigarettes   Smokeless tobacco: Never   Tobacco comments:    Quit smoking 2019  Vaping Use   Vaping status: Every Day  Substance Use Topics   Alcohol use: No   Drug use: No    Allergies: No Known Allergies  Medications Prior to Admission  Medication Sig Dispense Refill Last Dose   calcium-vitamin D (OSCAL WITH D) 500-200 MG-UNIT tablet Take 1 tablet by mouth 2 (two) times daily. 60 tablet 11     Review of Systems  Constitutional:  Negative for chills and fever.  Respiratory:  Negative for chest tightness and shortness of breath.   Cardiovascular:  Negative for chest pain and palpitations.  Gastrointestinal:  Negative for abdominal pain, constipation, diarrhea, nausea and vomiting.  Genitourinary:  Positive for pelvic pain and vaginal bleeding. Negative for dysuria and frequency.  Musculoskeletal:  Negative for back pain.  Neurological:  Negative for dizziness and  light-headedness.   Physical Exam   Blood pressure 116/63, pulse 74, temperature 98.4 F (36.9 C), resp. rate 18, height 5\' 3"  (1.6 m), weight 84.4 kg, last menstrual period 07/16/2023.  Physical Exam Constitutional:      Appearance: She is well-developed.  Cardiovascular:     Rate and Rhythm: Normal rate and regular rhythm.  Pulmonary:     Effort: Pulmonary effort is normal.     Breath sounds: Normal breath sounds.  Abdominal:     General: There is no distension.     Palpations: Abdomen is soft.     Tenderness: There is no abdominal tenderness.  Neurological:     General: No focal deficit present.     Mental Status: She is alert and oriented to person, place, and time.  Psychiatric:        Mood and Affect: Mood normal.        Behavior: Behavior normal.     MAU Course  Procedures  MDM Dana [name] is a 37 y.o. [phone] at [redacted]w[redacted]d who presents today for ongoing vaginal bleeding and pelvic pain. Recently seen in MAU for similar complaints -- a small subchorionic hemorrhage was seen on U/S - YS seen, but no FP. Today, vital signs stable, exam benign. Labs today show Rh pos status, uptrending quant, U/S with SIUP w +cardiac activity and small subchorionic hemorrhage  redemonstrated. She also had pos clue cells -- will tx for BV as well. Discussed return precautions in detail with patient. Pt to reach out to [facility] clinic to ensure f/up - has appt scheduled already.  Assessment and Plan  Subchorionic hemorrhage in first trimester Diagnosis discussed with patient in detail today  return precautions given  BV (bacterial vaginosis) - Plan: Discharge patient Will tx.   Patient discussed w Dr. Jarold Song, MD OB Fellow, Faculty Practice [facility], Center for [facility]  08/30/2023, 6:04 PM

## 2023-08-30 NOTE — MAU Note (Signed)
.  Dana [name] is a 37 y.o. at [redacted]w[redacted]d here in MAU reporting: was seen last week for vag bleeding. U/S was WNL. Started bleeding heavier since Saturday and having increased cramping. LMP: 07/16/23 Onset of complaint: Saturday Pain score: 7 Vitals:   08/30/23 1331  BP: 116/63  Pulse: 74  Resp: 18  Temp: 98.4 F (36.9 C)     FHT:n/a Lab orders placed from triage:

## 2023-09-01 ENCOUNTER — Inpatient Hospital Stay (HOSPITAL_COMMUNITY): Payer: Self-pay

## 2023-09-01 ENCOUNTER — Encounter (HOSPITAL_COMMUNITY): Payer: Self-pay | Admitting: Obstetrics and Gynecology

## 2023-09-01 ENCOUNTER — Inpatient Hospital Stay (HOSPITAL_COMMUNITY)
Admission: AD | Admit: 2023-09-01 | Discharge: 2023-09-01 | Disposition: A | Discharge status: Home / Self Care | Payer: Self-pay | Attending: Obstetrics and Gynecology | Admitting: Obstetrics and Gynecology

## 2023-09-01 DIAGNOSIS — O200 Threatened abortion: Secondary | ICD-10-CM | POA: Insufficient documentation

## 2023-09-01 DIAGNOSIS — Z3A01 Less than 8 weeks gestation of pregnancy: Secondary | ICD-10-CM | POA: Insufficient documentation

## 2023-09-01 DIAGNOSIS — O208 Other hemorrhage in early pregnancy: Secondary | ICD-10-CM | POA: Insufficient documentation

## 2023-09-01 LAB — COMPREHENSIVE METABOLIC PANEL
ALT: 15 U/L (ref 0–44)
AST: 14 U/L — ABNORMAL LOW (ref 15–41)
Albumin: 3.6 g/dL (ref 3.5–5.0)
Alkaline Phosphatase: 37 U/L — ABNORMAL LOW (ref 38–126)
Anion gap: 8 (ref 5–15)
BUN: 5 mg/dL — ABNORMAL LOW (ref 6–20)
CO2: 24 mmol/L (ref 22–32)
Calcium: 8.9 mg/dL (ref 8.9–10.3)
Chloride: 105 mmol/L (ref 98–111)
Creatinine, Ser: 0.63 mg/dL (ref 0.44–1.00)
GFR, Estimated: >60 mL/min (ref >60)
Glucose, Bld: 73 mg/dL (ref 70–99)
Potassium: 3.2 mmol/L — ABNORMAL LOW (ref 3.5–5.1)
Sodium: 137 mmol/L (ref 135–145)
Total Bilirubin: 0.3 mg/dL (ref 0.3–1.2)
Total Protein: 6.3 g/dL — ABNORMAL LOW (ref 6.5–8.1)

## 2023-09-01 LAB — CBC
HCT: 35 % — ABNORMAL LOW (ref 36.0–46.0)
Hemoglobin: 12.1 g/dL (ref 12.0–15.0)
MCH: 29.5 pg (ref 26.0–34.0)
MCHC: 34.6 g/dL (ref 30.0–36.0)
MCV: 85.4 fL (ref 80.0–100.0)
Platelets: 176 10*3/uL (ref 150–400)
RBC: 4.1 MIL/uL (ref 3.87–5.11)
RDW: 12.8 % (ref 11.5–15.5)
WBC: 3.6 10*3/uL — ABNORMAL LOW (ref 4.0–10.5)
nRBC: 0 % (ref 0.0–0.2)

## 2023-09-01 LAB — HCG, QUANTITATIVE, PREGNANCY: hCG, Beta Chain, Quant, S: 16128 m[IU]/mL — ABNORMAL HIGH (ref <5)

## 2023-09-01 NOTE — MAU Provider Note (Signed)
History     CSN: [id number]  Arrival date and time: 09/01/23 1506   Event Date/Time   First Provider Initiated Contact with Patient 09/01/23 1607      Chief Complaint  Patient presents with   Abdominal Pain   Vaginal Bleeding   HPI Dana [name] is a 37 y.o. [phone] at [redacted]w[redacted]d who presents today with complaint of vaginal bleeding. She was seen 9/10 for spotting and cramping and states after discharge from MAU her cramping and bleeding had initially improved but then around 3pm yesterday, bleeding got significantly worse. She describes passing large clots -- largest was the size of a lemon, others being smaller, like golf balls. She reports today, bleeding has slowed down some, but still present. She was saturating two pads an hour and given the extent of bleeding she has opted to wear adult underwear to avoid soaking through pads while here. She reports lightheadedness and dizziness. No fevers, but has felt chills.    Past Medical History:  Diagnosis Date   Preterm labor     Past Surgical History:  Procedure Laterality Date   BREAST REDUCTION SURGERY  2006    Family History  Problem Relation Age of Onset   Diabetes Mother        11/2022 diabetic coma   Healthy Father     Social History   Tobacco Use   Smoking status: Former    Types: Cigarettes   Smokeless tobacco: Never   Tobacco comments:    Quit smoking 2019  Vaping Use   Vaping status: Every Day  Substance Use Topics   Alcohol use: No   Drug use: No    Allergies: No Known Allergies  Medications Prior to Admission  Medication Sig Dispense Refill Last Dose   calcium-vitamin D (OSCAL WITH D) 500-200 MG-UNIT tablet Take 1 tablet by mouth 2 (two) times daily. 60 tablet 11    metroNIDAZOLE (FLAGYL) 500 MG tablet Take 1 tablet (500 mg total) by mouth 2 (two) times daily. 14 tablet 0     Review of Systems  Constitutional:  Positive for chills. Negative for fever.  HENT:  Negative for congestion.   Respiratory:   Negative for cough, shortness of breath and wheezing.   Cardiovascular:  Negative for chest pain and palpitations.  Gastrointestinal:  Positive for abdominal pain and nausea. Negative for constipation, diarrhea and vomiting.  Genitourinary:  Positive for pelvic pain and vaginal bleeding. Negative for dysuria and frequency.  Musculoskeletal:  Positive for back pain.  Neurological:  Positive for dizziness and light-headedness. Negative for headaches.   Physical Exam   Blood pressure 110/62, pulse 77, temperature 98.6 F (37 C), temperature source Oral, resp. rate 16, height 5\' 3"  (1.6 m), weight 85.1 kg, last menstrual period 07/16/2023, SpO2 100%.  Physical Exam Exam conducted with a chaperone present.  Constitutional:      General: She is not in acute distress.    Appearance: She is well-developed. She is not ill-appearing.  HENT:     Head: Normocephalic and atraumatic.  Cardiovascular:     Rate and Rhythm: Normal rate and regular rhythm.     Heart sounds: No murmur heard. Pulmonary:     Effort: Pulmonary effort is normal. No respiratory distress.     Breath sounds: Normal breath sounds.  Abdominal:     General: Abdomen is flat. There is no distension.     Palpations: Abdomen is soft.     Tenderness: There is abdominal tenderness in the suprapubic  area.  Genitourinary:    Vagina: Normal. No signs of injury. No erythema or bleeding.     Cervix: Cervical bleeding present.  Skin:    General: Skin is warm and dry.     Findings: No rash.  Neurological:     General: No focal deficit present.     Mental Status: She is alert and oriented to person, place, and time.     MAU Course  Procedures  MDM 37 y.o. [id number] at [redacted]w[redacted]d who presents for ongoing, worsening bleeding in setting of early pregnancy and known small subchorionic hemorrhage on previous imaging. Vital signs are stable, exam notable for mild TTP of suprapubic area. Will order repeat quant, CBC, CMP, U/S.   5:24 PM  CBC  ok, hCG 11,523. Awaiting U/S final read but appears to have SIUP w +cardiac activity on review. Speculum exam performed with Alphonzo Lemmings, RN. Pt was removing her adult underwear and en route back to the stretcher, had 4-5cm in diameter clot expulsed. On speculum exam, has moderate amount of dark blood in vaginal vault, bleeding noted from cervix.   6:06 PM  Reviewed images with Dr. Jolayne Panther -- IUP with +cardiac activity. Pt's bleeding threatened ab vs bleeding from subchorionic hemorrhage (still seen on U/S). Recommended bed rest, pelvic rest, and plan to repeat U/S again on 9/16 to assess viability. Discussed findings, concerns for threatened Ab, and return precautions in detail in patient. Plan for d/c at this time.  Assessment and Plan  Subchorionic hemorrhage of placenta in first trimester - Plan: Discharge patient  Threatened abortion in early pregnancy - Plan: Discharge patient  Patient discussed with Dr. Jolayne Panther.   Dana Aland, MD OB Fellow, Faculty Practice [facility], Center for [facility]  09/01/2023, 4:07 PM

## 2023-09-01 NOTE — MAU Note (Addendum)
.  Dana [name] is a 37 y.o. at [redacted]w[redacted]d here in MAU reporting: she has been seen in MAU over the past couple of days for bleeding and cramping. Last [place] on 9/16 showed yolk sac with no fetal pole visualized, so they scheduled follow up US for 9/16. Last hCG 11,523.   Yesterday while at work, she started feeling worse- nausea, cramping in her lower abdomen that shoots around to her lower back 9/10. She also had heavy VB and saturating pads 2x/hr. She did pass some clots, the first one was approximately the size of a "lemon", the rest of the clots were the size of a "golf ball". She did not noticed any tissue. She did get relief after taking tylenol PM yesterday, has not taken any today.  Today, she has continued to have heavy vaginal bleeding and cramping. She has not yet taken her BV med, due to fear of it causing more nausea. She is worried and wanted to check up on things since she was continuing to feel worse and have more bleeding.  LMP: N/A Onset of complaint: on-going Pain score: 9/10 Vitals:   09/01/23 1526  BP: 114/69  Pulse: 75  Resp: 16  Temp: 98.6 F (37 C)  SpO2: 100%     FHT:Not applicable Lab orders placed from triage:  UA

## 2023-09-05 ENCOUNTER — Ambulatory Visit (INDEPENDENT_AMBULATORY_CARE_PROVIDER_SITE_OTHER): Payer: Self-pay | Admitting: Obstetrics and Gynecology

## 2023-09-05 ENCOUNTER — Ambulatory Visit (INDEPENDENT_AMBULATORY_CARE_PROVIDER_SITE_OTHER): Payer: Self-pay

## 2023-09-05 ENCOUNTER — Other Ambulatory Visit: Payer: Self-pay

## 2023-09-05 ENCOUNTER — Encounter: Payer: Self-pay | Admitting: Obstetrics and Gynecology

## 2023-09-05 VITALS — BP 132/78 | HR 91 | Wt 182.0 lb

## 2023-09-05 DIAGNOSIS — Z3490 Encounter for supervision of normal pregnancy, unspecified, unspecified trimester: Secondary | ICD-10-CM

## 2023-09-05 DIAGNOSIS — O3680X0 Pregnancy with inconclusive fetal viability, not applicable or unspecified: Secondary | ICD-10-CM

## 2023-09-05 DIAGNOSIS — Z3A00 Weeks of gestation of pregnancy not specified: Secondary | ICD-10-CM

## 2023-09-05 DIAGNOSIS — O039 Complete or unspecified spontaneous abortion without complication: Secondary | ICD-10-CM

## 2023-09-05 DIAGNOSIS — Z3A01 Less than 8 weeks gestation of pregnancy: Secondary | ICD-10-CM

## 2023-09-05 NOTE — Progress Notes (Signed)
   GYNECOLOGY PROGRESS NOTE  History:  37 y.o. [id number] presents to [facility] office today for follow up ultrasound today. She reports still bleeding like a period, passing small clots. Was previously bleeding heavily with lemon sized and golf ball sized clots.  She denies h/a, dizziness, shortness of breath, n/v, or fever/chills.     Health Maintenance Due  Topic Date Due   Cervical Cancer Screening (HPV/Pap Cotest)  Never done   DTaP/Tdap/Td (2 - Td or Tdap) 09/28/2021   INFLUENZA VACCINE  07/21/2023   COVID-19 Vaccine (1 - 2023-24 season) Never done     Review of Systems:  Pertinent items are noted in HPI.   Objective:  Physical Exam Blood pressure 132/78, pulse 91, weight 182 lb (82.6 kg), last menstrual period 07/16/2023. VS reviewed, nursing note reviewed,  Constitutional: well developed, well nourished, no distress HEENT: normocephalic CV: normal rate Pulm/chest wall: normal effort Breast Exam: deferred Abdomen: soft Neuro: alert and oriented  Skin: warm, dry Psych: affect normal Pelvic exam: deferred  Assessment & Plan:  1. Spontaneous miscarriage Expressed sincerest apologies MAU 9/2 bleeding and cramping  subchorionic hemorrhage, u/s ys seen, but no fetal pole  MAU 9/10 bleeding [redacted]w[redacted]d, U/S with SIUP w +cardiac activity and small subchorionic hemorrhage measure 5x2x21mm  MAU 9/12 for threatened ab vs bleeding from [facility], IUP with +cardiac activity, subchorionic hemorrhage measure 10x8x29mm U/s 9/16 No IUP or fetal cardiac activity seen   Discussed results w/ Dr. Macon Large, no longer IUP Discussed results with patient, likely passed everything. Reiterated nothing she did to cause this. Discussed bleeding, what to expect. Offered follow up appt and counseling. Follow up precautions discussed   - Amb ref to Integrated Behavioral Health  Return in two weeks for SAB follow up   Future Appointments  Date Time Provider Department Center  09/15/2023 10:15 AM  [facility] HEALTH CLINICIAN [facility]  09/22/2023  8:35 AM Hermina Staggers, MD [facility]   Albertine Grates, FNP

## 2023-09-06 NOTE — BH Specialist Note (Signed)
Integrated Behavioral Health via Telemedicine Visit  09/15/2023 IRVING BODLE [phone]  Number of Integrated Behavioral Health Clinician visits: 1- Initial Visit  Session Start time: 1016   Session End time: 1031  Total time in minutes: 15 Pt consents to 15 minutes only today due to uninsured  Referring Provider: Albertine Grates, FNP Patient/Family location: Home [facility] Provider location: Center for [facility] at [facility]  All persons participating in visit: Patient Dana [name] and [facility] Jermell Holeman   Types of Service: Individual psychotherapy and Video visit  I connected with Reesa Chew and/or Melburn Hake Vantil's  n/a  via  Telephone or Video Enabled Telemedicine Application  (Video is Caregility application) and verified that I am speaking with the correct person using two identifiers. Discussed confidentiality: Yes   I discussed the limitations of telemedicine and the availability of in person appointments.  Discussed there is a possibility of technology failure and discussed alternative modes of communication if that failure occurs.  I discussed that engaging in this telemedicine visit, they consent to the provision of behavioral healthcare and the services will be billed under their insurance.  Patient and/or legal guardian expressed understanding and consented to Telemedicine visit: Yes   Presenting Concerns: Patient and/or family reports the following symptoms/concerns: Grieving loss of pregnancy.   Patient and/or Family's Strengths/Protective Factors: Social connections and Sense of purpose  Goals Addressed: Patient will:    Demonstrate ability to: Increase adequate support systems for patient/family and Begin healthy grieving over loss  Progress towards Goals: Ongoing  Interventions: Interventions utilized:  Link to [employer] and Supportive Reflection Standardized Assessments completed: Not Needed  Patient and/or Family  Response: Patient agrees with treatment plan.   Assessment: Patient currently experiencing Grief.   Patient may benefit from psychoeducation and brief therapeutic interventions regarding coping with grief .  Plan: Follow up with behavioral health clinician on : Two weeks Behavioral recommendations:  -Begin prioritizing healthy self-care (regular meals, adequate rest; allowing practical help from supportive friends and family) until at least postpartum medical appointment -Consider pregnancy loss support groups as needed at either www.postpartum.net or www.conehealthybaby.com  -Consider telling children about the loss; grieve and support one another  Referral(s): Integrated Art gallery manager (In Clinic) and [employer]:  Pregnancy Loss support groups  I discussed the assessment and treatment plan with the patient and/or parent/guardian. They were provided an opportunity to ask questions and all were answered. They agreed with the plan and demonstrated an understanding of the instructions.   They were advised to call back or seek an in-person evaluation if the symptoms worsen or if the condition fails to improve as anticipated.  Rae Lips, LCSW     02/08/2020   11:23 AM  Depression screen PHQ 2/9  Decreased Interest 1  Down, Depressed, Hopeless 0  PHQ - 2 Score 1  Altered sleeping 0  Tired, decreased energy 3  Change in appetite 3  Feeling bad or failure about yourself  0  Trouble concentrating 1  Moving slowly or fidgety/restless 0  Suicidal thoughts 0  PHQ-9 Score 8  Difficult doing work/chores Not difficult at all       No data to display

## 2023-09-15 ENCOUNTER — Emergency Department (HOSPITAL_COMMUNITY)
Admission: EM | Admit: 2023-09-15 | Discharge: 2023-09-16 | Disposition: A | Discharge status: Home / Self Care | Payer: Self-pay | Attending: Emergency Medicine | Admitting: Emergency Medicine

## 2023-09-15 ENCOUNTER — Other Ambulatory Visit: Payer: Self-pay

## 2023-09-15 ENCOUNTER — Ambulatory Visit: Payer: Self-pay | Admitting: Clinical

## 2023-09-15 ENCOUNTER — Encounter (HOSPITAL_COMMUNITY): Payer: Self-pay

## 2023-09-15 DIAGNOSIS — T465X2A Poisoning by other antihypertensive drugs, intentional self-harm, initial encounter: Secondary | ICD-10-CM | POA: Insufficient documentation

## 2023-09-15 DIAGNOSIS — T402X2A Poisoning by other opioids, intentional self-harm, initial encounter: Secondary | ICD-10-CM | POA: Insufficient documentation

## 2023-09-15 DIAGNOSIS — D72819 Decreased white blood cell count, unspecified: Secondary | ICD-10-CM | POA: Insufficient documentation

## 2023-09-15 DIAGNOSIS — R45851 Suicidal ideations: Secondary | ICD-10-CM

## 2023-09-15 DIAGNOSIS — E876 Hypokalemia: Secondary | ICD-10-CM | POA: Insufficient documentation

## 2023-09-15 DIAGNOSIS — F332 Major depressive disorder, recurrent severe without psychotic features: Secondary | ICD-10-CM | POA: Diagnosis present

## 2023-09-15 DIAGNOSIS — Z8659 Personal history of other mental and behavioral disorders: Secondary | ICD-10-CM

## 2023-09-15 DIAGNOSIS — O039 Complete or unspecified spontaneous abortion without complication: Secondary | ICD-10-CM | POA: Insufficient documentation

## 2023-09-15 DIAGNOSIS — F4321 Adjustment disorder with depressed mood: Secondary | ICD-10-CM

## 2023-09-15 DIAGNOSIS — T50902A Poisoning by unspecified drugs, medicaments and biological substances, intentional self-harm, initial encounter: Secondary | ICD-10-CM

## 2023-09-15 DIAGNOSIS — D649 Anemia, unspecified: Secondary | ICD-10-CM | POA: Insufficient documentation

## 2023-09-15 DIAGNOSIS — T481X2A Poisoning by skeletal muscle relaxants [neuromuscular blocking agents], intentional self-harm, initial encounter: Secondary | ICD-10-CM | POA: Insufficient documentation

## 2023-09-15 LAB — CBC WITH DIFFERENTIAL/PLATELET
Abs Immature Granulocytes: 0.01 10*3/uL (ref 0.00–0.07)
Basophils Absolute: 0 10*3/uL (ref 0.0–0.1)
Basophils Relative: 0 %
Eosinophils Absolute: 0 10*3/uL (ref 0.0–0.5)
Eosinophils Relative: 0 %
HCT: 33.1 % — ABNORMAL LOW (ref 36.0–46.0)
Hemoglobin: 11.2 g/dL — ABNORMAL LOW (ref 12.0–15.0)
Immature Granulocytes: 0 %
Lymphocytes Relative: 34 %
Lymphs Abs: 1.2 10*3/uL (ref 0.7–4.0)
MCH: 28.5 pg (ref 26.0–34.0)
MCHC: 33.8 g/dL (ref 30.0–36.0)
MCV: 84.2 fL (ref 80.0–100.0)
Monocytes Absolute: 0.2 10*3/uL (ref 0.1–1.0)
Monocytes Relative: 4 %
Neutro Abs: 2.2 10*3/uL (ref 1.7–7.7)
Neutrophils Relative %: 62 %
Platelets: 206 10*3/uL (ref 150–400)
RBC: 3.93 MIL/uL (ref 3.87–5.11)
RDW: 12.6 % (ref 11.5–15.5)
WBC: 3.6 10*3/uL — ABNORMAL LOW (ref 4.0–10.5)
nRBC: 0 % (ref 0.0–0.2)

## 2023-09-15 LAB — RAPID URINE DRUG SCREEN, HOSP PERFORMED
Amphetamines: NOT DETECTED
Barbiturates: NOT DETECTED
Benzodiazepines: NOT DETECTED
Cocaine: NOT DETECTED
Opiates: NOT DETECTED
Tetrahydrocannabinol: POSITIVE — AB

## 2023-09-15 LAB — SALICYLATE LEVEL: Salicylate Lvl: <7 mg/dL — ABNORMAL LOW (ref 7.0–30.0)

## 2023-09-15 LAB — COMPREHENSIVE METABOLIC PANEL
ALT: 16 U/L (ref 0–44)
AST: 13 U/L — ABNORMAL LOW (ref 15–41)
Albumin: 4.3 g/dL (ref 3.5–5.0)
Alkaline Phosphatase: 48 U/L (ref 38–126)
Anion gap: 11 (ref 5–15)
BUN: 8 mg/dL (ref 6–20)
CO2: 25 mmol/L (ref 22–32)
Calcium: 9 mg/dL (ref 8.9–10.3)
Chloride: 101 mmol/L (ref 98–111)
Creatinine, Ser: 0.39 mg/dL — ABNORMAL LOW (ref 0.44–1.00)
GFR, Estimated: >60 mL/min (ref >60)
Glucose, Bld: 88 mg/dL (ref 70–99)
Potassium: 2.6 mmol/L — CL (ref 3.5–5.1)
Sodium: 137 mmol/L (ref 135–145)
Total Bilirubin: 1 mg/dL (ref 0.3–1.2)
Total Protein: 7.5 g/dL (ref 6.5–8.1)

## 2023-09-15 LAB — HCG, QUANTITATIVE, PREGNANCY: hCG, Beta Chain, Quant, S: 64 m[IU]/mL — ABNORMAL HIGH (ref <5)

## 2023-09-15 LAB — ETHANOL: Alcohol, Ethyl (B): <10 mg/dL (ref <10)

## 2023-09-15 LAB — MAGNESIUM: Magnesium: 2.3 mg/dL (ref 1.7–2.4)

## 2023-09-15 LAB — ACETAMINOPHEN LEVEL: Acetaminophen (Tylenol), Serum: <10 ug/mL — ABNORMAL LOW (ref 10–30)

## 2023-09-15 LAB — HCG, SERUM, QUALITATIVE: Preg, Serum: POSITIVE — AB

## 2023-09-15 MED ORDER — POTASSIUM CHLORIDE CRYS ER 20 MEQ PO TBCR
40.0000 meq | EXTENDED_RELEASE_TABLET | Freq: Once | ORAL | Status: AC
  Administered 2023-09-15: 40 meq via ORAL
  Filled 2023-09-15: qty 2

## 2023-09-15 MED ORDER — POTASSIUM CHLORIDE 10 MEQ/100ML IV SOLN
10.0000 meq | INTRAVENOUS | Status: AC
  Administered 2023-09-15 (×2): 10 meq via INTRAVENOUS
  Filled 2023-09-15 (×2): qty 100

## 2023-09-15 NOTE — Patient Instructions (Signed)
Center for [facility] at [facility] [address] [place], [place] [address] [phone] (main office) [phone] (Alanny Rivers's office)  Pregnancy Loss Support Groups at: www.postpartum.net www.conehealthybaby.com  /Emotional Wellbeing Apps and Websites Here are a few free apps meant to help you to help yourself.  To find, try searching on the internet to see if the app is offered on Apple/Android devices. If your first choice doesn't come up on your device, the good news is that there are many choices! Play around with different apps to see which ones are helpful to you.    Calm This is an app meant to help increase calm feelings. Includes info, strategies, and tools for tracking your feelings.      Calm Harm  This app is meant to help with self-harm. Provides many 5-minute or 15-min coping strategies for doing instead of hurting yourself.       Healthy Minds Health Minds is a problem-solving tool to help deal with emotions and cope with stress you encounter wherever you are.      MindShift This app can help people cope with anxiety. Rather than trying to avoid anxiety, you can make an important shift and face it.      MY3  MY3 features a support system, safety plan and resources with the goal of offering a tool to use in a time of need.       My Life My Voice  This mood journal offers a simple solution for tracking your thoughts, feelings and moods. Animated emoticons can help identify your mood.       Relax Melodies Designed to help with sleep, on this app you can mix sounds and meditations for relaxation.      Smiling Mind Smiling Mind is meditation made easy: it's a simple tool that helps put a smile on your mind.        Stop, Breathe & Think  A friendly, simple guide for people through meditations for mindfulness and compassion.  Stop, Breathe and Think Kids Enter your current feelings and choose a "mission" to help you cope.  Offers videos for certain moods instead of just sound recordings.       Team Orange The goal of this tool is to help teens change how they think, act, and react. This app helps you focus on your own good feelings and experiences.      The [employer] Box The [employer] Box (VHB) contains simple tools to help patients with coping, relaxation, distraction, and positive thinking.

## 2023-09-15 NOTE — ED Notes (Signed)
Patients phone is with her belongings

## 2023-09-15 NOTE — ED Provider Notes (Signed)
37 year old female with overdose. Will be monitored until 3am and can then be medically cleared for TTS.  Hypo K at 2.6. Pending repeat BMET.  Repeat APAP level.  IVC Recent miscarriage.  Physical Exam  BP 113/69   Pulse 71   Temp 98.1 F (36.7 C) (Oral)   Resp 17   Ht 5\' 3"  (1.6 m)   Wt 80.3 kg   LMP 07/16/2023   SpO2 100%   BMI 31.35 kg/m   Physical Exam  Procedures  Procedures  ED Course / MDM   Clinical Course as of 09/16/23 0252  Thu Sep 15, 2023  2026 Reassessed patient at bedside.  Patient sitting up in bed.  No longer drowsy.  Patient in no acute distress.  Vitals remained stable. [CA]  2048 Potassium(!!): 2.6 [CA]  2049 WBC(!): 3.6 [CA]  2049 Hemoglobin(!): 11.2 [CA]  2049 Tetrahydrocannabinol(!): POSITIVE Potassium 2.6. IV and oral potassium ordered. [CA]  2214 Reassessed patient at bedside.  Patient resting comfortably in bed.  Family at bedside. [CA]  2340 Reassessed patient. Patient sleeping in bed. Sitter at bedside. Normal vitals on cardiac monitor.  [CA]    Clinical Course User Index [CA] Mannie Stabile, PA-C   Medical Decision Making Amount and/or Complexity of Data Reviewed Labs: ordered. Decision-making details documented in ED Course.  Risk Prescription drug management.   K improved on repeat panel. Second tylenol level negative. Has completed observation period as advised by poison control. Medically cleared for [facility] evaluation and disposition.       Jeannie Fend, PA-C 09/16/23 0252    Nira Conn, MD 09/16/23 [phone]

## 2023-09-15 NOTE — ED Provider Notes (Signed)
[place] EMERGENCY DEPARTMENT AT [facility] Provider Note   CSN: [id number] Arrival date & time: 09/15/23  1903     History  Chief Complaint  Patient presents with   Drug Overdose    Dana [name] is a 37 y.o. female with no significant past medical history who presents to the ED after a possible overdose.  Per EMS patient texted husband insinuating she was going to overdose. Children found mother locked in room and drowsy after school today. EMS found bottles of clonidine 0.1mg  30 pills total- 5 left, Losartan- empty bottle, Percocet 5-325mg - empty bottle, Cyclobenzaprine 5mg , 20 pills total- 9 left.  Patient denies any ingestion.  Notes she has not been eating or drinking over the past few days.  Had a recent spontaneous miscarriage which has caused some increased depression and stress.  Notes she has been having difficulty sleeping.  When asked if patient is having suicidal ideations patient states "I do not know".  Denies HI.  Denies auditory/visual hallucinations.  Admits she has pain throughout her body however, unable to specify where exactly she is having pain. No nausea or vomiting.  History obtained from patient and past medical records. No interpreter used during encounter.       Home Medications Prior to Admission medications   Medication Sig Start Date End Date Taking? Authorizing Provider  calcium-vitamin D (OSCAL WITH D) 500-200 MG-UNIT tablet Take 1 tablet by mouth 2 (two) times daily. Patient not taking: Reported on 09/05/2023 02/08/20   Marny Lowenstein, PA-C  metroNIDAZOLE (FLAGYL) 500 MG tablet Take 1 tablet (500 mg total) by mouth 2 (two) times daily. Patient not taking: Reported on 09/05/2023 08/30/23   [name], MD      Allergies    Patient has no known allergies.    Review of Systems   Review of Systems  Respiratory:  Negative for shortness of breath.   Cardiovascular:  Negative for chest pain.  Gastrointestinal:  Negative for abdominal  pain.    Physical Exam Updated Vital Signs BP 134/83   Pulse 73   Temp 99 F (37.2 C) (Oral)   Resp 14   Ht 5\' 3"  (1.6 m)   Wt 80.3 kg   LMP 07/16/2023   SpO2 99%   BMI 31.35 kg/m  Physical Exam Vitals and nursing note reviewed.  Constitutional:      General: She is not in acute distress.    Appearance: She is not ill-appearing.  HENT:     Head: Normocephalic.  Eyes:     Pupils: Pupils are equal, round, and reactive to light.  Cardiovascular:     Rate and Rhythm: Normal rate and regular rhythm.     Pulses: Normal pulses.     Heart sounds: Normal heart sounds. No murmur heard.    No friction rub. No gallop.  Pulmonary:     Effort: Pulmonary effort is normal.     Breath sounds: Normal breath sounds.  Abdominal:     General: Abdomen is flat. There is no distension.     Palpations: Abdomen is soft.     Tenderness: There is no abdominal tenderness. There is no guarding or rebound.  Musculoskeletal:        General: Normal range of motion.     Cervical back: Neck supple.  Skin:    General: Skin is warm and dry.  Neurological:     General: No focal deficit present.     Mental Status: She is alert.  Psychiatric:        Mood and Affect: Mood is depressed.        Behavior: Behavior is withdrawn.     ED Results / Procedures / Treatments   Labs (all labs ordered are listed, but only abnormal results are displayed) Labs Reviewed  COMPREHENSIVE METABOLIC PANEL - Abnormal; Notable for the following components:      Result Value   Potassium 2.6 (*)    Creatinine, Ser 0.39 (*)    AST 13 (*)    All other components within normal limits  RAPID URINE DRUG SCREEN, HOSP PERFORMED - Abnormal; Notable for the following components:   Tetrahydrocannabinol POSITIVE (*)    All other components within normal limits  CBC WITH DIFFERENTIAL/PLATELET - Abnormal; Notable for the following components:   WBC 3.6 (*)    Hemoglobin 11.2 (*)    HCT 33.1 (*)    All other components within  normal limits  HCG, SERUM, QUALITATIVE - Abnormal; Notable for the following components:   Preg, Serum POSITIVE (*)    All other components within normal limits  ACETAMINOPHEN LEVEL - Abnormal; Notable for the following components:   Acetaminophen (Tylenol), Serum <10 (*)    All other components within normal limits  SALICYLATE LEVEL - Abnormal; Notable for the following components:   Salicylate Lvl <7.0 (*)    All other components within normal limits  HCG, QUANTITATIVE, PREGNANCY - Abnormal; Notable for the following components:   hCG, Beta Chain, Quant, S 64 (*)    All other components within normal limits  ETHANOL  MAGNESIUM  BASIC METABOLIC PANEL    EKG EKG Interpretation Date/Time:  Thursday September 15 2023 19:46:20 EDT Ventricular Rate:  79 PR Interval:  181 QRS Duration:  99 QT Interval:  385 QTC Calculation: 442 R Axis:   67  Text Interpretation: Sinus rhythm Probable left ventricular hypertrophy No previous ECGs available Confirmed by Elayne Snare ([id number]) on 09/15/2023 9:12:29 PM  Radiology No results found.  Procedures Procedures    Medications Ordered in ED Medications  potassium chloride SA (KLOR-CON M) CR tablet 40 mEq (40 mEq Oral Given 09/15/23 2057)  potassium chloride 10 mEq in 100 mL IVPB (0 mEq Intravenous Stopped 09/15/23 2312)    ED Course/ Medical Decision Making/ A&P Clinical Course as of 09/15/23 2344  Thu Sep 15, 2023  2026 Reassessed patient at bedside.  Patient sitting up in bed.  No longer drowsy.  Patient in no acute distress.  Vitals remained stable. [CA]  2048 Potassium(!!): 2.6 [CA]  2049 WBC(!): 3.6 [CA]  2049 Hemoglobin(!): 11.2 [CA]  2049 Tetrahydrocannabinol(!): POSITIVE Potassium 2.6. IV and oral potassium ordered. [CA]  2214 Reassessed patient at bedside.  Patient resting comfortably in bed.  Family at bedside. [CA]  2340 Reassessed patient. Patient sleeping in bed. Sitter at bedside. Normal vitals on cardiac monitor.   [CA]    Clinical Course User Index [CA] Mannie Stabile, PA-C                                 Medical Decision Making Amount and/or Complexity of Data Reviewed Independent Historian: EMS    Details: Brother and [employer] External Data Reviewed: notes.    Details: OBGYN note Labs: ordered. Decision-making details documented in ED Course. ECG/medicine tests: ordered and independent interpretation performed. Decision-making details documented in ED Course.  Risk Prescription drug management.   37 year old female presents to the ED  after a possible overdose.  Patient IVC'd by [employer].  Patient found locked in bedroom by children and was supposedly drowsy.  Patient had a recent spontaneous miscarriage. EMS found bottles of clonidine 0.1mg  30 pills total- 5 left, Losartan- empty bottle, Percocet 5-325mg - empty bottle, Cyclobenzaprine 5mg , 20 pills total- 9 left.  Patient denies any ingestion. Possible SI? Denies HI and auditory/visual hallucinations. RN spoke to poison control who recommends adding a magnesium level and watch out for respiration depression. Patient will require a minimum 8 hour observation. Intervene with IVFs and presssors for BP if needed and atropine for bradycardia if presents in patient. Medical clearance labs ordered.   CBC significant for leukopenia 3.6.  Anemia with hemoglobin 11.2.  Normal platelets.  UDS positive for THC.  Ethanol, acetaminophen, and salicylate levels within normal limits.  CMP significant for hypokalemia 2.6.  Potassium repleted.  Will Recheck potassium after IV potassium given.  Magnesium normal.  Quantitative hCG downtrending consistent with recent spontaneous miscarriage. EKG NSR. No signs of acute ischemia.   Patient reassessed numerous times.  See notes above.  Patient handed off to Army Melia, PA-C at shift change pending 8 hour observation and repeat BMP.   Lives at home Has PCP Hx anemia       Final Clinical Impression(s) / ED  Diagnoses Final diagnoses:  Intentional overdose, initial encounter [facility])    Rx / DC Orders ED Discharge Orders     None         Mannie Stabile, PA-C 09/15/23 2344    Elayne Snare K, DO 09/16/23 1507

## 2023-09-15 NOTE — ED Provider Notes (Incomplete)
37 year old female with overdose. Will be monitored until 3am and can then be medically cleared for TTS.  Hypo K at 2.6. Pending repeat BMET.  Repeat APAP level.  IVC Recent miscarriage.  Physical Exam  BP 134/83   Pulse 73   Temp 99 F (37.2 C) (Oral)   Resp 14   Ht 5\' 3"  (1.6 m)   Wt 80.3 kg   LMP 07/16/2023   SpO2 99%   BMI 31.35 kg/m   Physical Exam  Procedures  Procedures  ED Course / MDM   Clinical Course as of 09/15/23 2341  Thu Sep 15, 2023  2026 Reassessed patient at bedside.  Patient sitting up in bed.  No longer drowsy.  Patient in no acute distress.  Vitals remained stable. [CA]  2048 Potassium(!!): 2.6 [CA]  2049 WBC(!): 3.6 [CA]  2049 Hemoglobin(!): 11.2 [CA]  2049 Tetrahydrocannabinol(!): POSITIVE Potassium 2.6. IV and oral potassium ordered. [CA]  2214 Reassessed patient at bedside.  Patient resting comfortably in bed.  Family at bedside. [CA]  2340 Reassessed patient. Patient sleeping in bed. Sitter at bedside. Normal vitals on cardiac monitor.  [CA]    Clinical Course User Index [CA] Mannie Stabile, PA-C   Medical Decision Making Amount and/or Complexity of Data Reviewed Labs: ordered. Decision-making details documented in ED Course.  Risk Prescription drug management.   ***

## 2023-09-15 NOTE — ED Triage Notes (Signed)
Pt arrives EMS with [employer] for intentional drug overdose, IVC. Pt is lethargic, but denies taking any pills, but there were bottles found of clonidine (0.1, 30 count, 5 pills left), losartan (empty bottle), percocet (5-325 mg ,empty bottle) cyclobenzaprine (5mg , 20 count, 9 left). 118/77 BP  72 HR 99% room air

## 2023-09-15 NOTE — ED Notes (Signed)
Poison control was contacted regarding patients ingestion of medications.They advised the labs we are performing are adequate and to add magnesium level. Was made aware to watch out for respiratory depression and blood pressure with the medications she ingested. Advised could intervene with fluids and medications when those symptoms arise. Will call back in 4 hours for recheck on patient. Minimum 8 hours of observation time recommended per patients case. MD notified.

## 2023-09-16 ENCOUNTER — Encounter (HOSPITAL_COMMUNITY): Payer: Self-pay

## 2023-09-16 DIAGNOSIS — O039 Complete or unspecified spontaneous abortion without complication: Secondary | ICD-10-CM | POA: Insufficient documentation

## 2023-09-16 DIAGNOSIS — F332 Major depressive disorder, recurrent severe without psychotic features: Secondary | ICD-10-CM | POA: Diagnosis present

## 2023-09-16 DIAGNOSIS — R45851 Suicidal ideations: Secondary | ICD-10-CM

## 2023-09-16 DIAGNOSIS — Z8659 Personal history of other mental and behavioral disorders: Secondary | ICD-10-CM

## 2023-09-16 HISTORY — DX: Complete or unspecified spontaneous abortion without complication: O03.9

## 2023-09-16 LAB — BASIC METABOLIC PANEL
Anion gap: 9 (ref 5–15)
BUN: 7 mg/dL (ref 6–20)
CO2: 22 mmol/L (ref 22–32)
Calcium: 8.7 mg/dL — ABNORMAL LOW (ref 8.9–10.3)
Chloride: 106 mmol/L (ref 98–111)
Creatinine, Ser: 0.57 mg/dL (ref 0.44–1.00)
GFR, Estimated: >60 mL/min (ref >60)
Glucose, Bld: 85 mg/dL (ref 70–99)
Potassium: 3.3 mmol/L — ABNORMAL LOW (ref 3.5–5.1)
Sodium: 137 mmol/L (ref 135–145)

## 2023-09-16 LAB — ACETAMINOPHEN LEVEL: Acetaminophen (Tylenol), Serum: <10 ug/mL — ABNORMAL LOW (ref 10–30)

## 2023-09-16 MED ORDER — HYDROXYZINE HCL 25 MG PO TABS
25.0000 mg | ORAL_TABLET | Freq: Once | ORAL | Status: AC
  Administered 2023-09-16: 25 mg via ORAL
  Filled 2023-09-16: qty 1

## 2023-09-16 MED ORDER — HYDROXYZINE HCL 25 MG PO TABS: 25.0000 mg | ORAL_TABLET | Freq: Three times a day (TID) | ORAL | 0 refills | Status: AC | PRN

## 2023-09-16 NOTE — BH Assessment (Signed)
TTS consult will be completed by IRIS provider. IRIS coordinator will reach out in secure chat with the time of assessment and the provider that will complete assessment.

## 2023-09-16 NOTE — ED Notes (Signed)
Patient resting with sitter in the room. No complaints or needs expressed at this time.

## 2023-09-16 NOTE — ED Notes (Signed)
Patient requested to call fiance and check on the kids for her and let him know she is ok. Given number and attempted to call. No answer, left voicemail.

## 2023-09-16 NOTE — ED Provider Notes (Signed)
Emergency Medicine Observation Re-evaluation Note  Dana [name] is a 37 y.o. female, seen on rounds today.  Pt initially presented to the ED for complaints of Drug Overdose Currently, the patient is resting comfortably.  She however informs me that she feels slightly anxious.  Physical Exam  BP 112/65   Pulse 76   Temp 98 F (36.7 C)   Resp 18   Ht 5\' 3"  (1.6 m)   Wt 80.3 kg   LMP 07/16/2023   SpO2 100%   Breastfeeding No   BMI 31.35 kg/m  Physical Exam General: No distress Cardiac: Regular rate Lungs: No respiratory distress Psych: Currently calm  ED Course / MDM  EKG:EKG Interpretation Date/Time:  Thursday September 15 2023 19:46:20 EDT Ventricular Rate:  79 PR Interval:  181 QRS Duration:  99 QT Interval:  385 QTC Calculation: 442 R Axis:   67  Text Interpretation: Sinus rhythm Probable left ventricular hypertrophy No previous ECGs available Confirmed by Elayne Snare ([id number]) on 09/15/2023 9:12:29 PM  I have reviewed the labs performed to date as well as medications administered while in observation.  Recent changes in the last 24 hours include -no new changes.  Patient is being observed by the psychiatry team.  Plan  Current plan is for observation for now.  Psychiatry disposition pending.  2:46 PM Psychiatry team has cleared the patient.  They will rescind the IVC.    Derwood Kaplan, MD 09/16/23 1446

## 2023-09-16 NOTE — ED Notes (Signed)
Poison control called and given updated vitals. Will call back at the 8 hour mark to see how patient is doing.

## 2023-09-16 NOTE — Discharge Instructions (Signed)
  Discharge recommendations:  Patient is to take medications as prescribed. Please see information for follow-up appointment with psychiatry and therapy. Please follow up with your primary care provider for all medical related needs.   Therapy: We recommend that patient participate in individual therapy to address mental health concerns.  Medications: The patient or guardian is to contact a medical professional and/or outpatient provider to address any new side effects that develop. The patient or guardian should update outpatient providers of any new medications and/or medication changes.   Atypical antipsychotics: If you are prescribed an atypical antipsychotic, it is recommended that your height, weight, BMI, blood pressure, fasting lipid panel, and fasting blood sugar be monitored by your outpatient providers.  Safety:  The patient should abstain from use of illicit substances/drugs and abuse of any medications. If symptoms worsen or do not continue to improve or if the patient becomes actively suicidal or homicidal then it is recommended that the patient return to the closest hospital emergency department, the [facility], or call 911 for further evaluation and treatment. National Suicide Prevention Lifeline 1-800-SUICIDE or 609-196-0153.  About 988 988 offers 24/7 access to trained crisis counselors who can help people experiencing mental health-related distress. People can call or text 988 or chat 988lifeline.org for themselves or if they are worried about a loved one who may need crisis support.  Crisis Mobile: [facility]:                     (978) 227-7904 (for crisis response 24 hours a day) [facility] Hotline:                                            (972)040-0609     Safety Plan Dana [name] will reach out to her husband Dana [name]., call 911 or call mobile crisis, or go to nearest emergency room if condition worsens or if  suicidal thoughts become active Patients' will follow up with behavioral health urgent care for outpatient psychiatric services (therapy/medication management).  The suicide prevention education provided includes the following: Suicide risk factors Suicide prevention and interventions National Suicide Hotline telephone number [facility] assessment telephone number [facility] Emergency Assistance 911 [facility] and/or Residential Mobile Crisis Unit telephone number Request made of family/significant other to:  husband Dana [name] Remove weapons (e.g., guns, rifles, knives), all items previously/currently identified as safety concern.   Remove drugs/medications (over the counter, prescriptions, illicit drugs), all items previously/currently identified as a safety concern.

## 2023-09-16 NOTE — Consult Note (Signed)
[facility] ED ASSESSMENT   Reason for Consult: Psych Consult Referring Physician: Army Melia, PA-C Patient Identification: Dana [name] MRN:  [id number] ED Chief Complaint: MDD (major depressive disorder), recurrent severe, without psychosis (HCC)  Diagnosis:  Principal Problem:   MDD (major depressive disorder), recurrent severe, without psychosis (HCC) Active Problems:   Suicide ideation   Miscarriage   ED Assessment Time Calculation: Start Time: 1000 Stop Time: 1040 Total Time in Minutes (Assessment Completion): 40   Subjective:  Dana [name] is a 37 y.o. female patient admitted with no significant past medical history who presents to the ED after a possible overdose.    HPI:  Dana [name], 37 y.o., female patient seen face to face by this provider, consulted with Dr. Lucianne Muss; and chart reviewed on 09/16/23.  On evaluation Dana [name] reports that she recently had a miscarriage on 09/14/2023, states it has been affecting her and she has been feeling depressed about it.  She adamantly denies taking any of the pills that were found on her floor, states that she was upset and not the pills to the floor, states most of the pill bottles were empty.  She denies any suicidal behaviors prior, denies having any past psychiatric history, states she does not take any psychiatric medications.  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  She states that she had a moment of depression about her miscarriage, but states she would never try to take her life due to having to care for her other 3 children.  She states she has a good support system in her fianc Dellia Nims, her adopted mother and father, and her biological mother and family, she says she fell at that moment she had no one to turn to but she knows that she has great family support.  She denies using any drugs or alcohol but occasionally says she uses marijuana, UDS is positive for THC  BAL less than 10.  She currently denies SI/HI/AVH, states this is her first miscarriage, says she was very devastated by it.  She states that her appetite and sleep have been good.  During evaluation Dana [name] is sitting on the hospital gurney in no acute distress. She is alert, oriented x 3, calm, cooperative and attentive. Her mood is euthymic with congruent affect.  She has normal speech, and behavior.  Objectively there is no evidence of psychosis/mania or delusional thinking.  Patient is able to converse coherently, goal directed thoughts, no distractibility, or pre-occupation.  She also denies suicidal/self-harm/homicidal ideation, psychosis, and paranoia.  Patient answered question appropriately.  She states that she also did not have her mother here who was on a cruise, usually she states she can call mom anytime and it was hard for her not to be able to speak with her immediately.  Patient states she has seen Hulda Marin at integrated behavioral [facility] for therapy appointment was on 09/15/23.  Patient's fiance Dellia Nims is at bedside, patient gives permission for psychiatric team to speak with him, he does not feel like patient is a danger to self or anyone else, feels that this was patient being very impulsive, states that she is the glue that holds the family together and she loves being around their children and taking care of their children. Discussed with Dellia Nims safety planning to have the patient return home today, as she does not present currently as an imminent risk to herself  or others at this time. Dellia Nims reported he was ok with the patient returning home.  Spoke with him about the partial hospitalization program, and informed them of what it was about, patient is in agreement to attend the program.  Gave patient information to [facility] at the [facility] program.  Patient's fiance Dellia Nims states that he will be home with the patient all weekend, states that her mother comes back from her cruise  on Saturday night and she will be over to spend time with patient, Dellia Nims states that he will safety plan and lock all the medications up, remove any dangerous objects.  Denies having any access to firearms.  Patient able to contract for safety.   Past Psychiatric History: Depression and anxiety  Risk to Self or Others: Risk to Self: No.  Risk to Others:  No  Prior Inpatient Therapy: No Prior Outpatient Therapy: Yes   [place] Scale:  Flowsheet Row Admission (Discharged) from 09/01/2023 in [facility] 1S Maternity Assessment Unit Admission (Discharged) from 08/22/2023 in [facility] 1S Maternity Assessment Unit  C-SSRS RISK CATEGORY No Risk No Risk       AIMS:  , , ,  ,   ASAM:    Substance Abuse:     Past Medical History:  Past Medical History:  Diagnosis Date   Preterm labor     Past Surgical History:  Procedure Laterality Date   BREAST REDUCTION SURGERY  2006   Family History:  Family History  Problem Relation Age of Onset   Diabetes Mother        11/2022 diabetic coma   Healthy Father     Social History:  Social History   Substance and Sexual Activity  Alcohol Use No     Social History   Substance and Sexual Activity  Drug Use No    Social History   Socioeconomic History   Marital status: Single    Spouse name: Not on file   Number of children: Not on file   Years of education: Not on file   Highest education level: Not on file  Occupational History   Not on file  Tobacco Use   Smoking status: Former    Types: Cigarettes   Smokeless tobacco: Never   Tobacco comments:    Quit smoking 2019  Vaping Use   Vaping status: Every Day  Substance and Sexual Activity   Alcohol use: No   Drug use: No   Sexual activity: Not Currently  Other Topics Concern   Not on file  Social History Narrative   Not on file   Social Determinants of Health   Financial Resource Strain: Low Risk  (05/31/2023)   Received from [facility]   Overall Financial Resource Strain  (CARDIA)    Difficulty of Paying Living Expenses: Not very hard  Food Insecurity: No Food Insecurity (05/31/2023)   [facility]   Hunger Vital Sign    Worried About Running Out of Food in the Last Year: Never true    Ran Out of Food in the Last Year: Never true  Transportation Needs: No Transportation Needs (05/31/2023)   Received from [facility] - Transportation    Lack of Transportation (Medical): No    Lack of Transportation (Non-Medical): No  Physical Activity: Not on file  Stress: Not on file  Social Connections: Unknown (05/03/2022)   Received from [facility]   Social Network    Social Network: Not on file  Allergies:  No Known Allergies  Labs:  Results for orders placed or performed during the hospital encounter of 09/15/23 (from the past 48 hour(s))  Urine rapid drug screen (hosp performed)     Status: Abnormal   Collection Time: 09/15/23  7:48 PM  Result Value Ref Range   Opiates NONE DETECTED NONE DETECTED   Cocaine NONE DETECTED NONE DETECTED   Benzodiazepines NONE DETECTED NONE DETECTED   Amphetamines NONE DETECTED NONE DETECTED   Tetrahydrocannabinol POSITIVE (A) NONE DETECTED   Barbiturates NONE DETECTED NONE DETECTED    Comment: (NOTE) DRUG SCREEN FOR MEDICAL PURPOSES ONLY.  IF CONFIRMATION IS NEEDED FOR ANY PURPOSE, NOTIFY LAB WITHIN 5 DAYS.  LOWEST DETECTABLE LIMITS FOR URINE DRUG SCREEN Drug Class                     Cutoff (ng/mL) Amphetamine and metabolites    1000 Barbiturate and metabolites    200 Benzodiazepine                 200 Opiates and metabolites        300 Cocaine and metabolites        300 THC                            50 Performed at [facility], [address], [place], [place] [address]   Comprehensive metabolic panel     Status: Abnormal   Collection Time: 09/15/23  8:07 PM  Result Value Ref Range   Sodium 137 135 - 145 mmol/L   Potassium 2.6 (LL) 3.5 - 5.1  mmol/L    Comment: CRITICAL RESULT CALLED TO, READ BACK BY AND VERIFIED WITH METCALF,A RN @ 2047 09/15/23 BY CHILDRESS,E   Chloride 101 98 - 111 mmol/L   CO2 25 22 - 32 mmol/L   Glucose, Bld 88 70 - 99 mg/dL    Comment: Glucose reference range applies only to samples taken after fasting for at least 8 hours.   BUN 8 6 - 20 mg/dL   Creatinine, Ser 4.09 (L) 0.44 - 1.00 mg/dL   Calcium 9.0 8.9 - 81.1 mg/dL   Total Protein 7.5 6.5 - 8.1 g/dL   Albumin 4.3 3.5 - 5.0 g/dL   AST 13 (L) 15 - 41 U/L   ALT 16 0 - 44 U/L   Alkaline Phosphatase 48 38 - 126 U/L   Total Bilirubin 1.0 0.3 - 1.2 mg/dL   GFR, Estimated >91 >47 mL/min    Comment: (NOTE) Calculated using the CKD-EPI Creatinine Equation (2021)    Anion gap 11 5 - 15    Comment: Performed at [facility], [address] [place], [place] [address]  Ethanol     Status: None   Collection Time: 09/15/23  8:07 PM  Result Value Ref Range   Alcohol, Ethyl (B) <10 <10 mg/dL    Comment: (NOTE) Lowest detectable limit for serum alcohol is 10 mg/dL.  For medical purposes only. Performed at [facility], [address] [place], [place] [address]   CBC with Diff     Status: Abnormal   Collection Time: 09/15/23  8:07 PM  Result Value Ref Range   WBC 3.6 (L) 4.0 - 10.5 K/uL   RBC 3.93 3.87 - 5.11 MIL/uL   Hemoglobin 11.2 (L) 12.0 - 15.0 g/dL   HCT 65.7 (L) 84.6 - 96.2 %   MCV 84.2 80.0 -  100.0 fL   MCH 28.5 26.0 - 34.0 pg   MCHC 33.8 30.0 - 36.0 g/dL   RDW 29.5 28.4 - 13.2 %   Platelets 206 150 - 400 K/uL   nRBC 0.0 0.0 - 0.2 %   Neutrophils Relative % 62 %   Neutro Abs 2.2 1.7 - 7.7 K/uL   Lymphocytes Relative 34 %   Lymphs Abs 1.2 0.7 - 4.0 K/uL   Monocytes Relative 4 %   Monocytes Absolute 0.2 0.1 - 1.0 K/uL   Eosinophils Relative 0 %   Eosinophils Absolute 0.0 0.0 - 0.5 K/uL   Basophils Relative 0 %   Basophils Absolute 0.0 0.0 - 0.1 K/uL   Immature Granulocytes 0 %   Abs Immature  Granulocytes 0.01 0.00 - 0.07 K/uL    Comment: Performed at [facility], [address], [place], [place] [address]  hCG, serum, qualitative     Status: Abnormal   Collection Time: 09/15/23  8:07 PM  Result Value Ref Range   Preg, Serum POSITIVE (A) NEGATIVE    Comment:        THE SENSITIVITY OF THIS METHODOLOGY IS >10 mIU/mL. Performed at [facility], [address], [place], [place] [address]   Acetaminophen level     Status: Abnormal   Collection Time: 09/15/23  8:07 PM  Result Value Ref Range   Acetaminophen (Tylenol), Serum <10 (L) 10 - 30 ug/mL    Comment: (NOTE) Therapeutic concentrations vary significantly. A range of 10-30 ug/mL  may be an effective concentration for many patients. However, some  are best treated at concentrations outside of this range. Acetaminophen concentrations >150 ug/mL at 4 hours after ingestion  and >50 ug/mL at 12 hours after ingestion are often associated with  toxic reactions.  Performed at [facility], [address] [place], [place] [address]   Salicylate level     Status: Abnormal   Collection Time: 09/15/23  8:07 PM  Result Value Ref Range   Salicylate Lvl <7.0 (L) 7.0 - 30.0 mg/dL    Comment: Performed at [facility], [address], [place], [place] [address]  Magnesium     Status: None   Collection Time: 09/15/23  8:07 PM  Result Value Ref Range   Magnesium 2.3 1.7 - 2.4 mg/dL    Comment: Performed at [facility], [address] [place], [place] [address]  hCG, quantitative, pregnancy     Status: Abnormal   Collection Time: 09/15/23  8:07 PM  Result Value Ref Range   hCG, Beta Chain, Quant, S 64 (H) <5 mIU/mL    Comment:          GEST. AGE      CONC.  (mIU/mL)   <=1 WEEK        5 - 50     2 WEEKS       50 - 500     3 WEEKS       100 - 10,000     4 WEEKS     1,000 - 30,000     5 WEEKS     3,500 - 115,000   6-8 WEEKS     12,000  - 270,000    12 WEEKS     15,000 - 220,000        FEMALE AND NON-PREGNANT FEMALE:     LESS THAN 5 mIU/mL Performed at [facility], [address] [place], [place]  [address]   Basic metabolic panel     Status: Abnormal   Collection Time: 09/16/23 12:13 AM  Result Value Ref Range   Sodium 137 135 - 145 mmol/L   Potassium 3.3 (L) 3.5 - 5.1 mmol/L   Chloride 106 98 - 111 mmol/L   CO2 22 22 - 32 mmol/L   Glucose, Bld 85 70 - 99 mg/dL    Comment: Glucose reference range applies only to samples taken after fasting for at least 8 hours.   BUN 7 6 - 20 mg/dL   Creatinine, Ser 6.04 0.44 - 1.00 mg/dL   Calcium 8.7 (L) 8.9 - 10.3 mg/dL   GFR, Estimated >54 >09 mL/min    Comment: (NOTE) Calculated using the CKD-EPI Creatinine Equation (2021)    Anion gap 9 5 - 15    Comment: Performed at [facility], [address] [place], [place] [address]  Acetaminophen level     Status: Abnormal   Collection Time: 09/16/23 12:13 AM  Result Value Ref Range   Acetaminophen (Tylenol), Serum <10 (L) 10 - 30 ug/mL    Comment: (NOTE) Therapeutic concentrations vary significantly. A range of 10-30 ug/mL  may be an effective concentration for many patients. However, some  are best treated at concentrations outside of this range. Acetaminophen concentrations >150 ug/mL at 4 hours after ingestion  and >50 ug/mL at 12 hours after ingestion are often associated with  toxic reactions.  Performed at [facility], [address], [place], [place] [address]     No current facility-administered medications for this encounter.   Current Outpatient Medications  Medication Sig Dispense Refill   acetaminophen (TYLENOL) 500 MG tablet Take 500 mg by mouth every 6 (six) hours as needed.     calcium-vitamin D (OSCAL WITH D) 500-200 MG-UNIT tablet Take 1 tablet by mouth 2 (two) times daily. (Patient not taking: Reported on 09/05/2023) 60 tablet 11   metroNIDAZOLE  (FLAGYL) 500 MG tablet Take 1 tablet (500 mg total) by mouth 2 (two) times daily. (Patient not taking: Reported on 09/05/2023) 14 tablet 0    Musculoskeletal: Strength & Muscle Tone: within normal limits Gait & Station: normal Patient leans: N/A   Psychiatric Specialty Exam: Presentation  General Appearance: Appropriate for Environment  Eye Contact:Good  Speech:Clear and Coherent  Speech Volume:Normal  Handedness:Right   Mood and Affect  Mood:Anxious; Euthymic  Affect:Appropriate   Thought Process  Thought Processes:Coherent  Descriptions of Associations:Intact  Orientation:Full (Time, Place and Person)  Thought Content:WDL  History of Schizophrenia/Schizoaffective disorder:No data recorded Duration of Psychotic Symptoms:No data recorded Hallucinations:Hallucinations: None  Ideas of Reference:None  Suicidal Thoughts:Suicidal Thoughts: No  Homicidal Thoughts:Homicidal Thoughts: No   Sensorium  Memory:Immediate Good; Recent Good; Remote Good  Judgment:Fair  Insight:Fair   Executive Functions  Concentration:Fair  Attention Span:Fair  Recall:Fair  Fund of Knowledge:Fair  Language:Fair   Psychomotor Activity  Psychomotor Activity:Psychomotor Activity: Normal   Assets  Assets:Communication Skills; Desire for Improvement; Social Support; Intimacy    Sleep  Sleep:Sleep: Poor   Physical Exam: Physical Exam Vitals and nursing note reviewed. Exam conducted with a chaperone present.  Psychiatric:        Mood and Affect: Mood normal.        Behavior: Behavior normal.        Thought Content: Thought content normal.        Judgment: Judgment normal.    Review of Systems  Constitutional: Negative.   Psychiatric/Behavioral:  Positive for depression.  Blood pressure 105/68, pulse 79, temperature 97.8 F (36.6 C), temperature source Oral, resp. rate 13, height 5\' 3"  (1.6 m), weight 80.3 kg, last menstrual period 07/16/2023, SpO2 100%, not  currently breastfeeding. Body mass index is 31.35 kg/m.   Medical Decision Making: Patient is psychiatrically cleared. Patient case review and discussed with Dr. Lucianne Muss, and patient does not meet inpatient criteria for inpatient psychiatric treatment. At time of discharge, patient denies SI, HI, AVH and can contract for safety. She demonstrated no overt evidence of psychosis or mania. Prior to discharge, she verbalized that they understood warning signs, triggers, and symptoms of worsening mental health and how to access emergency mental health care if they felt it was needed. Patient was instructed to call 911 or return to the emergency room if they experienced any concerning symptoms after discharge. Patient voiced understanding and agreed to the above.  Patient given resources to follow up with behavioral health urgent care for therapy and medication management. Patient given information for PHP program and patient info given to  [facility] at the [facility] program. Patient denies access to weapons. Safety planning completed.    Safety Plan MARINE LEZOTTE will reach out to her husband Charolett Bumpers., call 911 or call mobile crisis, or go to nearest emergency room if condition worsens or if suicidal thoughts become active Patients' will follow up with behavioral health urgent care for outpatient psychiatric services (therapy/medication management).  The suicide prevention education provided includes the following: Suicide risk factors Suicide prevention and interventions National Suicide Hotline telephone number [facility] assessment telephone number [facility] Emergency Assistance 911 [facility] and/or Residential Mobile Crisis Unit telephone number Request made of family/significant other to:  husband Alphonso W Remove weapons (e.g., guns, rifles, knives), all items previously/currently identified as safety concern.   Remove drugs/medications (over the counter, prescriptions, illicit  drugs), all items previously/currently identified as a safety concern.     Disposition: Patient does not meet criteria for psychiatric inpatient admission. Supportive therapy provided about ongoing stressors. Discussed crisis plan, support from social network, calling 911, coming to the Emergency Department, and calling Suicide Hotline.  Alona Bene, PMHNP 09/16/2023 1:52 PM

## 2023-09-16 NOTE — BH Assessment (Signed)
At 0701 [facility] Coordinator Sharyon Cable stated in secure chat "Hello I apologize we are unable to see him overnight within our coverage hours. We will need to defer this pt back to in house in this morning" Please see note in chart, patient referred to [facility] at 0445.

## 2023-09-20 NOTE — BH Specialist Note (Signed)
Integrated Behavioral Health via Telemedicine Visit  10/04/2023 Dana [name] [id number]  Number of Integrated Behavioral Health Clinician visits: 2- Second Visit  Session Start time: 1107   Session End time: 1130  Total time in minutes: 23 *Pt consents to only 15 minutes, due to uninsured  Referring Provider: Albertine Grates, FNP Patient/Family location: Home [facility] Provider location: Center for [facility] Healthcare at [facility]  All persons participating in visit: Patient Dana [name] and Dana [name]   Types of Service: Individual psychotherapy and Video visit  I connected with Dana [name] and/or Dana [name]'s  n/a  via  Telephone or Video Enabled Telemedicine Application  (Video is Caregility application) and verified that I am speaking with the correct person using two identifiers. Discussed confidentiality: Yes   I discussed the limitations of telemedicine and the availability of in person appointments.  Discussed there is a possibility of technology failure and discussed alternative modes of communication if that failure occurs.  I discussed that engaging in this telemedicine visit, they consent to the provision of behavioral healthcare and the services will be billed under their insurance.  Patient and/or legal guardian expressed understanding and consented to Telemedicine visit: Yes   Presenting Concerns: Patient and/or family reports the following symptoms/concerns: Processing emotional distress with guilt and shame while grieving that led to ED visit; after talking to family and friends, she's feeling very supported; considering birth control to allow time for physical and emotional healing.  Duration of problem: Post-miscarriage; Severity of problem: moderate  Patient and/or Family's Strengths/Protective Factors: Social connections, Concrete supports in place (healthy food, safe environments, etc.), Sense of purpose, and Physical Health  (exercise, healthy diet, medication compliance, etc.)  Goals Addressed: Patient will:  Reduce symptoms of: mood instability    Demonstrate ability to: Increase motivation to adhere to plan of care and Continue healthy grieving   Progress towards Goals: Ongoing  Interventions: Interventions utilized:  Motivational Interviewing and Supportive Counseling Standardized Assessments completed: Not Needed  Patient and/or Family Response: Patient agrees with treatment plan.   Assessment: Patient currently experiencing Grief.   Patient may benefit from continued therapeutic intervention  Plan: Follow up with behavioral health clinician on : Two weeks Behavioral recommendations:  -Continue plan to call ob/gyn and schedule appointment for birth control and postpartum check -Continue allowing family and friends to be supportive; maintain open communication  -Continue to consider pregnancy loss support group, if needed -Consider BH Urgent Care if needed in the future (information on After Visit Summary) Referral(s): Integrated [employer] (In Clinic)  I discussed the assessment and treatment plan with the patient and/or parent/guardian. They were provided an opportunity to ask questions and all were answered. They agreed with the plan and demonstrated an understanding of the instructions.   They were advised to call back or seek an in-person evaluation if the symptoms worsen or if the condition fails to improve as anticipated.  Dana [name] Dana Laguardia, LCSW

## 2023-09-22 ENCOUNTER — Ambulatory Visit: Payer: Self-pay | Admitting: Obstetrics and Gynecology

## 2023-10-01 ENCOUNTER — Encounter: Payer: Self-pay | Admitting: Certified Nurse Midwife

## 2023-10-04 ENCOUNTER — Ambulatory Visit: Payer: Self-pay | Admitting: Clinical

## 2023-10-04 DIAGNOSIS — F4321 Adjustment disorder with depressed mood: Secondary | ICD-10-CM

## 2023-10-04 NOTE — Patient Instructions (Addendum)
Center for [facility] Healthcare at [facility] for Women [address] [place], [place] [address] [phone] (main office) [phone] (Jarion Hawthorne's office)  Pregnancy Loss Support Groups www.[email or web address].net  [facility]  [address] [place], [place] [address] [phone] or [phone] [facility] 24/7 FOR ANYONE [address], [place], [place]  [phone] Fax: [phone] [email or web address] *Interpreters available *Accepts all insurance and uninsured for Urgent Care needs *Accepts Medicaid and uninsured for outpatient treatment (below)    ONLY FOR [facility]  Below:   Outpatient New Patient Assessment/Therapy Walk-ins:        Monday -Thursday 8am until slots are full.        Every Friday 1pm-4pm  (first come, first served)                   New Patient Psychiatry/Medication Management        Monday-Friday 8am-11am (first come, first served)              For all walk-ins we ask that you arrive by 7:15am, because patients will be seen in the order of arrival.

## 2023-10-10 NOTE — BH Specialist Note (Signed)
Integrated Behavioral Health via Telemedicine Visit  10/24/2023 Dana [name] [id number]  Number of Integrated Behavioral Health Clinician visits: 3- Third Visit  Session Start time: 1030   Session End time: 1045  Total time in minutes: 15   Referring Provider: Albertine Grates, FNP Patient/Family location: Home [facility] Provider location: Center for [facility]  All persons participating in visit: Patient Dana [name] and [facility] Dana [name]   Types of Service: Individual psychotherapy and Video visit  I connected with Dana [name] and/or Dana [name]'s  n/a  via  Telephone or Video Enabled Telemedicine Application  (Video is Caregility application) and verified that I am speaking with the correct person using two identifiers. Discussed confidentiality: Yes   I discussed the limitations of telemedicine and the availability of in person appointments.  Discussed there is a possibility of technology failure and discussed alternative modes of communication if that failure occurs.  I discussed that engaging in this telemedicine visit, they consent to the provision of behavioral healthcare and the services will be billed under their insurance.  Patient and/or legal guardian expressed understanding and consented to Telemedicine visit: Yes   Presenting Concerns: Patient and/or family reports the following symptoms/concerns: Overall improved mood, with acceptance of loss and the support of others; concerned about cravings for ice, which was experienced in the past with low iron.  Duration of problem: Post-miscarriage; Severity of problem: mild  Patient and/or Family's Strengths/Protective Factors: Social connections, Social and Emotional competence, Concrete supports in place (healthy food, safe environments, etc.), Sense of purpose, and Physical Health (exercise, healthy diet, medication compliance, etc.)  Goals Addressed: Patient will:   Increase  knowledge and/or ability of: healthy habits   Demonstrate ability to: Increase healthy adjustment to current life circumstances  Progress towards Goals: Ongoing  Interventions: Interventions utilized:  Supportive Reflection Standardized Assessments completed: Not Needed  Patient and/or Family Response: Patient agrees with treatment plan.   Assessment: Patient currently experiencing Grief.   Patient may benefit from continued therapeutic intervention  .  Plan: Follow up with behavioral health clinician on : Call Cailen Texeira at [phone], as needed. Behavioral recommendations:  -Continue open communication with supportive friends and family -Continue plan to discuss ice craving with PCP   I discussed the assessment and treatment plan with the patient and/or parent/guardian. They were provided an opportunity to ask questions and all were answered. They agreed with the plan and demonstrated an understanding of the instructions.   They were advised to call back or seek an in-person evaluation if the symptoms worsen or if the condition fails to improve as anticipated.  Rae Lips, LCSW     10/24/2023   10:31 AM 02/08/2020   11:23 AM  Depression screen PHQ 2/9  Decreased Interest 0 1  Down, Depressed, Hopeless 0 0  PHQ - 2 Score 0 1  Altered sleeping 0 0  Tired, decreased energy 1 3  Change in appetite 0 3  Feeling bad or failure about yourself  0 0  Trouble concentrating 0 1  Moving slowly or fidgety/restless 0 0  Suicidal thoughts 0 0  PHQ-9 Score 1 8  Difficult doing work/chores  Not difficult at all      10/24/2023   10:34 AM  GAD 7 : Generalized Anxiety Score  Nervous, Anxious, on Edge 1  Control/stop worrying 0  Worry too much - different things 0  Trouble relaxing 0  Restless 1  Easily annoyed or irritable 0  Afraid - awful might happen 0  Total GAD 7 Score 2

## 2023-10-24 ENCOUNTER — Ambulatory Visit: Payer: Self-pay | Admitting: Clinical

## 2023-10-24 DIAGNOSIS — F4321 Adjustment disorder with depressed mood: Secondary | ICD-10-CM

## 2023-11-03 ENCOUNTER — Telehealth (INDEPENDENT_AMBULATORY_CARE_PROVIDER_SITE_OTHER): Payer: Self-pay | Admitting: Obstetrics & Gynecology

## 2023-11-03 DIAGNOSIS — Z30011 Encounter for initial prescription of contraceptive pills: Secondary | ICD-10-CM

## 2023-11-03 MED ORDER — NORGESTIM-ETH ESTRAD TRIPHASIC 0.18/0.215/0.25 MG-25 MCG PO TABS
1.0000 | ORAL_TABLET | Freq: Every day | ORAL | 11 refills | Status: DC
Start: 2023-11-03 — End: 2024-07-29

## 2023-11-03 NOTE — Progress Notes (Signed)
MyChart GYN, for [facility].  Pt wants to start OCP.  She recently had a miscarriage (08/30/23).  Pt is not currently sexually active.

## 2023-11-03 NOTE — Progress Notes (Signed)
   TELEHEALTH GYNECOLOGY VISIT ENCOUNTER NOTE  Provider location: Center for [facility]   Patient location: Home  I connected with Dana [name] on 11/03/23 at  2:50 PM EST by telephone and verified that I am speaking with the correct person using two identifiers. Patient was unable to do MyChart audiovisual encounter due to technical difficulties, she tried several times.    I discussed the limitations, risks, security and privacy concerns of performing an evaluation and management service by telephone and the availability of in person appointments. I also discussed with the patient that there may be a patient responsible charge related to this service. The patient expressed understanding and agreed to proceed.   History:  Dana [name] is a 37 y.o. [phone] female being evaluated today for initiation of OCP use. She denies any abnormal vaginal discharge, bleeding, pelvic pain or other concerns.       Past Medical History:  Diagnosis Date   Preterm labor    Past Surgical History:  Procedure Laterality Date   BREAST REDUCTION SURGERY  2006   The following portions of the patient's history were reviewed and updated as appropriate: allergies, current medications, past family history, past medical history, past social history, past surgical history and problem list.   Health Maintenance:  Normal pap and negative HRHPV on 01/2020.   Review of Systems:  Pertinent items noted in HPI and remainder of comprehensive ROS otherwise negative.  Physical Exam:   General:  Alert, oriented and cooperative.   Mental Status: Normal mood and affect perceived. Normal judgment and thought content.  Physical exam deferred due to nature of the encounter  Labs and Imaging No results found for this or any previous visit (from the past 336 hour(s)). No results found.    Assessment and Plan:     1. Oral contraception initiation Meds ordered this encounter  Medications   Norgestim-Eth  Estrad Triphasic (NORGESTIMATE-ETHINYL ESTRADIOL TRIPHASIC) 0.18/0.215/0.25 MG-25 MCG tab    Sig: Take 1 tablet by mouth daily.    Dispense:  28 tablet    Refill:  11          I discussed the assessment and treatment plan with the patient. The patient was provided an opportunity to ask questions and all were answered. The patient agreed with the plan and demonstrated an understanding of the instructions.   The patient was advised to call back or seek an in-person evaluation/go to the ED if the symptoms worsen or if the condition fails to improve as anticipated.  I provided 10 minutes of non-face-to-face time during this encounter. Including chart review and documentation  Scheryl Darter, MD Center for [facility], [facility]

## 2024-07-25 ENCOUNTER — Ambulatory Visit: Payer: Self-pay

## 2024-07-29 ENCOUNTER — Other Ambulatory Visit: Payer: Self-pay

## 2024-07-29 ENCOUNTER — Encounter (HOSPITAL_COMMUNITY): Payer: Self-pay | Admitting: Obstetrics & Gynecology

## 2024-07-29 ENCOUNTER — Inpatient Hospital Stay (HOSPITAL_COMMUNITY)
Admission: AD | Admit: 2024-07-29 | Discharge: 2024-07-29 | Disposition: A | Discharge status: Home / Self Care | Payer: Self-pay | Attending: Obstetrics & Gynecology | Admitting: Obstetrics & Gynecology

## 2024-07-29 DIAGNOSIS — O219 Vomiting of pregnancy, unspecified: Secondary | ICD-10-CM | POA: Diagnosis present

## 2024-07-29 DIAGNOSIS — Z3A08 8 weeks gestation of pregnancy: Secondary | ICD-10-CM | POA: Diagnosis not present

## 2024-07-29 DIAGNOSIS — R42 Dizziness and giddiness: Secondary | ICD-10-CM | POA: Diagnosis present

## 2024-07-29 LAB — URINALYSIS, ROUTINE W REFLEX MICROSCOPIC
Bilirubin Urine: NEGATIVE
Glucose, UA: NEGATIVE mg/dL
Hgb urine dipstick: NEGATIVE
Ketones, ur: 5 mg/dL — AB
Leukocytes,Ua: NEGATIVE
Nitrite: NEGATIVE
Protein, ur: NEGATIVE mg/dL
Specific Gravity, Urine: 1.013 (ref 1.005–1.030)
pH: 6 (ref 5.0–8.0)

## 2024-07-29 LAB — POCT PREGNANCY, URINE: Preg Test, Ur: POSITIVE — AB

## 2024-07-29 MED ORDER — DOXYLAMINE-PYRIDOXINE 10-10 MG PO TBEC: 2.0000 | DELAYED_RELEASE_TABLET | Freq: Every day | ORAL | 5 refills | Status: DC

## 2024-07-29 MED ORDER — SCOPOLAMINE 1 MG/3DAYS TD PT72: 1.0000 | MEDICATED_PATCH | TRANSDERMAL | 12 refills | Status: DC

## 2024-07-29 MED ORDER — SCOPOLAMINE 1 MG/3DAYS TD PT72
1.0000 | MEDICATED_PATCH | TRANSDERMAL | Status: DC
  Administered 2024-07-29: 1.5 mg via TRANSDERMAL
  Filled 2024-07-29: qty 1

## 2024-07-29 MED ORDER — ONDANSETRON 4 MG PO TBDP
4.0000 mg | ORAL_TABLET | Freq: Once | ORAL | Status: AC
  Administered 2024-07-29: 4 mg via ORAL
  Filled 2024-07-29: qty 1

## 2024-07-29 MED ORDER — ONDANSETRON 4 MG PO TBDP: 4.0000 mg | ORAL_TABLET | Freq: Once | ORAL | 2 refills | Status: AC

## 2024-07-29 NOTE — Discharge Instructions (Signed)

## 2024-07-29 NOTE — MAU Provider Note (Signed)
 Chief Complaint: Nausea and Emesis  SUBJECTIVE HPI: Dana [name] is a 38 y.o. [phone] at [redacted]w[redacted]d by LMP who presents to maternity admissions reporting positive home pregnancy test two weeks ago. Nausea and vomiting for the last 1.5 weeks. Only able to tolerate water sips. Lightheadedness last night after showering but not now. Denies abdominal pain or cramping.   She denies vaginal bleeding, vaginal itching/burning, urinary symptoms, h/a, dizziness, n/v, or fever/chills.    HPI  Past Medical History:  Diagnosis Date   Preterm labor    Past Surgical History:  Procedure Laterality Date   BREAST REDUCTION SURGERY  2006   Social History   Socioeconomic History   Marital status: Single    Spouse name: Not on file   Number of children: Not on file   Years of education: Not on file   Highest education level: Not on file  Occupational History   Not on file  Tobacco Use   Smoking status: Former    Types: Cigarettes   Smokeless tobacco: Never   Tobacco comments:    Quit smoking 2019  Vaping Use   Vaping status: Former   Devices: Stopped 2 weeks ago  Substance and Sexual Activity   Alcohol use: No   Drug use: No   Sexual activity: Not Currently  Other Topics Concern   Not on file  Social History Narrative   Not on file   Social Drivers of Health   Financial Resource Strain: Low Risk  (05/31/2023)   Received from [facility]   Overall Financial Resource Strain (CARDIA)    Difficulty of Paying Living Expenses: Not very hard  Food Insecurity: No Food Insecurity (05/31/2023)   [facility]   Hunger Vital Sign    Within the past 12 months, you worried that your food would run out before you got the money to buy more.: Never true    Within the past 12 months, the food you bought just didn't last and you didn't have money to get more.: Never true  Transportation Needs: No Transportation Needs (05/31/2023)   Received from [facility]     Lack of Transportation (Medical): No    Lack of Transportation (Non-Medical): No  Physical Activity: Not on file  Stress: Not on file  Social Connections: Unknown (05/03/2022)   [facility] from [facility]   Social Network    Social Network: Not on file  Intimate Partner Violence: Unknown (03/26/2022)   Received from [facility]   HITS    Physically Hurt: Not on file    Insult or Talk Down To: Not on file    Threaten Physical Harm: Not on file    Scream or Curse: Not on file   No current facility-administered medications on file prior to encounter.   Current Outpatient Medications on File Prior to Encounter  Medication Sig Dispense Refill   acetaminophen  (TYLENOL ) 500 MG tablet Take 500 mg by mouth every 6 (six) hours as needed.     calcium -vitamin D  (OSCAL WITH D) 500-200 MG-UNIT tablet Take 1 tablet by mouth 2 (two) times daily. (Patient not taking: Reported on 09/05/2023) 60 tablet 11   hydrOXYzine  (ATARAX ) 25 MG tablet Take 1 tablet (25 mg total) by mouth every 8 (eight) hours as needed for anxiety. 30 tablet 0   metroNIDAZOLE  (FLAGYL ) 500 MG tablet Take 1 tablet (500 mg total) by mouth 2 (two) times daily. (Patient not taking: Reported on 09/05/2023) 14 tablet 0  Norgestim-Eth Estrad Triphasic (NORGESTIMATE-ETHINYL ESTRADIOL TRIPHASIC) 0.18/0.215/0.25 MG-25 MCG tab Take 1 tablet by mouth daily. 28 tablet 11   No Known Allergies  ROS:  Pertinent positives/negatives listed above.  I have reviewed patient's Past Medical Hx, Surgical Hx, Family Hx, Social Hx, medications and allergies.   Physical Exam  Patient Vitals for the past 24 hrs:  BP Temp Temp src Pulse Resp SpO2 Height Weight  07/29/24 1841 -- -- -- -- -- -- 5' 3 (1.6 m) 81.6 kg  07/29/24 1837 123/75 99.3 F (37.4 C) Oral 81 18 100 % -- --   Constitutional: Well-developed, well-nourished female in no acute distress.  Cardiovascular: normal rate Respiratory: normal effort GI: Abd soft, non-tender. Pos BS x 4 MS:  Extremities nontender, no edema, normal ROM Neurologic: Alert and oriented x 4.  GU: Neg CVAT.  LAB RESULTS Results for orders placed or performed during the hospital encounter of 07/29/24 (from the past 24 hours)  Pregnancy, urine POC     Status: Abnormal   Collection Time: 07/29/24  6:43 PM  Result Value Ref Range   Preg Test, Ur POSITIVE (A) NEGATIVE  Urinalysis, Routine w reflex microscopic -Urine, Clean Catch     Status: Abnormal   Collection Time: 07/29/24  6:55 PM  Result Value Ref Range   Color, Urine YELLOW YELLOW   APPearance CLEAR CLEAR   Specific Gravity, Urine 1.013 1.005 - 1.030   pH 6.0 5.0 - 8.0   Glucose, UA NEGATIVE NEGATIVE mg/dL   Hgb urine dipstick NEGATIVE NEGATIVE   Bilirubin Urine NEGATIVE NEGATIVE   Ketones, ur 5 (A) NEGATIVE mg/dL   Protein, ur NEGATIVE NEGATIVE mg/dL   Nitrite NEGATIVE NEGATIVE   Leukocytes,Ua NEGATIVE NEGATIVE    --/--/AB POS Performed at [facility], [address], [place], [place] [address]  [phone])  IMAGING No results found.  MAU Management/MDM: Orders Placed This Encounter  Procedures   Urinalysis, Routine w reflex microscopic -Urine, Clean Catch   Pregnancy, urine POC   Discharge patient Discharge disposition: 01-Home or Self Care; Discharge patient date: 07/29/2024    Meds ordered this encounter  Medications   ondansetron  (ZOFRAN -ODT) disintegrating tablet 4 mg   scopolamine  (TRANSDERM-SCOP) 1 MG/3DAYS 1.5 mg   ondansetron  (ZOFRAN -ODT) 4 MG disintegrating tablet    Sig: Take 1 tablet (4 mg total) by mouth once for 1 dose.    Dispense:  20 tablet    Refill:  2   scopolamine  (TRANSDERM-SCOP) 1 MG/3DAYS    Sig: Place 1 patch (1.5 mg total) onto the skin every 3 (three) days.    Dispense:  10 patch    Refill:  12   Doxylamine -Pyridoxine  (DICLEGIS ) 10-10 MG TBEC    Sig: Take 2 tablets by mouth at bedtime. If symptoms persist, add one tablet in the morning and one in the afternoon    Dispense:  100 tablet     Refill:  5    Or equivalent parts    ASSESSMENT 1. [redacted] weeks gestation of pregnancy   2. Nausea/vomiting in pregnancy   UA r/o dehydration. Tolerating sips prior to arrival.  Trial zofran  & scopolamine  patch - tolerating crackers and juice, feeling much improved.   Limited bedside US : SIUP, FHR 162  PLAN Discharge home with strict return precautions. Continue routine prenatal care as scheduled.  Allergies as of 07/29/2024   No Known Allergies      Medication List     STOP taking these medications    Norgestimate-Ethinyl Estradiol  Triphasic 0.18/0.215/0.25 MG-25 MCG tab       TAKE these medications    acetaminophen  500 MG tablet Commonly known as: TYLENOL  Take 500 mg by mouth every 6 (six) hours as needed.   calcium -vitamin D  500-200 MG-UNIT tablet Commonly known as: OSCAL WITH D Take 1 tablet by mouth 2 (two) times daily.   Doxylamine -Pyridoxine  10-10 MG Tbec Commonly known as: Diclegis  Take 2 tablets by mouth at bedtime. If symptoms persist, add one tablet in the morning and one in the afternoon   hydrOXYzine  25 MG tablet Commonly known as: ATARAX  Take 1 tablet (25 mg total) by mouth every 8 (eight) hours as needed for anxiety.   metroNIDAZOLE  500 MG tablet Commonly known as: FLAGYL  Take 1 tablet (500 mg total) by mouth 2 (two) times daily.   ondansetron  4 MG disintegrating tablet Commonly known as: ZOFRAN -ODT Take 1 tablet (4 mg total) by mouth once for 1 dose.   scopolamine  1 MG/3DAYS Commonly known as: TRANSDERM-SCOP Place 1 patch (1.5 mg total) onto the skin every 3 (three) days.        Follow-up Information     [facility] Follow up.   Specialty: Obstetrics and Gynecology Why: As needed for pregnancy emergencies Contact information: [address] [place]  [address] [phone]                Mardy Shropshire, MD FMOB Fellow, Faculty practice [facility], Center for [facility]  07/29/2024  8:16 PM

## 2024-07-29 NOTE — MAU Note (Signed)
 Dana [name] is a 38 y.o. at Unknown here in MAU reporting: she had a +HPT two weeks ago and for the past 1.5 weeks has been really sick with nausea and vomiting.  Reports unable to keep anything down including water.  States she felt dizzy last night and today after showering.  Denies current VB, had some spotting.  Denies cramping, but abdomen tender from excessive vomiting.  LMP: 05/31/2024 Onset of complaint: 1.5 weeks ago Pain score: 4 Vitals:   07/29/24 1837  BP: 123/75  Pulse: 81  Resp: 18  Temp: 99.3 F (37.4 C)  SpO2: 100%     FHT: NA  Lab orders placed from triage: UPT

## 2024-07-30 ENCOUNTER — Ambulatory Visit: Payer: Self-pay

## 2024-08-06 ENCOUNTER — Other Ambulatory Visit: Payer: Self-pay

## 2024-08-06 MED ORDER — PRENATAL 28-0.8 MG PO TABS: 1.0000 | ORAL_TABLET | Freq: Every day | ORAL | 12 refills | Status: DC

## 2024-08-17 ENCOUNTER — Other Ambulatory Visit (INDEPENDENT_AMBULATORY_CARE_PROVIDER_SITE_OTHER): Payer: Self-pay

## 2024-08-17 ENCOUNTER — Ambulatory Visit: Admitting: *Deleted

## 2024-08-17 VITALS — BP 119/77 | HR 86 | Wt 183.2 lb

## 2024-08-17 DIAGNOSIS — Z3A11 11 weeks gestation of pregnancy: Secondary | ICD-10-CM

## 2024-08-17 DIAGNOSIS — O3680X0 Pregnancy with inconclusive fetal viability, not applicable or unspecified: Secondary | ICD-10-CM

## 2024-08-17 DIAGNOSIS — O0990 Supervision of high risk pregnancy, unspecified, unspecified trimester: Secondary | ICD-10-CM

## 2024-08-17 DIAGNOSIS — O0991 Supervision of high risk pregnancy, unspecified, first trimester: Secondary | ICD-10-CM

## 2024-08-17 DIAGNOSIS — Z1332 Encounter for screening for maternal depression: Secondary | ICD-10-CM

## 2024-08-17 MED ORDER — VITAFOL GUMMIES 3.33-0.333-34.8 MG PO CHEW: 3.0000 | CHEWABLE_TABLET | Freq: Every day | ORAL | 11 refills | Status: AC

## 2024-08-17 MED ORDER — BLOOD PRESSURE KIT DEVI: 1.0000 | 0 refills | Status: DC

## 2024-08-17 NOTE — Patient Instructions (Signed)

## 2024-08-17 NOTE — Progress Notes (Signed)
 New OB Intake  I connected with Dana [name] Ill  on 08/17/24 at  8:15 AM EDT by In Person Visit and verified that I am speaking with the correct person using two identifiers. Nurse is located at [facility] and pt is located at [place].  I discussed the limitations, risks, security and privacy concerns of performing an evaluation and management service by telephone and the availability of in person appointments. I also discussed with the patient that there may be a patient responsible charge related to this service. The patient expressed understanding and agreed to proceed.  I explained I am completing New OB Intake today. We discussed EDD of 03/07/25 based on LMP of 05/31/24. Pt is [id number]. I reviewed her allergies, medications and Medical/Surgical/OB history.    Patient Active Problem List   Diagnosis Date Noted   Supervision of high risk pregnancy, antepartum 08/17/2024   MDD (major depressive disorder), recurrent severe, without psychosis (HCC) 09/16/2023   Suicide ideation 09/16/2023   Miscarriage 09/16/2023   Anemia complicating pregnancy 10/03/2011   Preterm delivery, delivered 10/02/2011     Concerns addressed today  Delivery Plans Plans to deliver at [facility]. Discussed the nature of our practice with multiple providers including residents and students as well as female and female providers. Due to the size of the practice, the delivering provider may not be the same as those providing prenatal care.   Patient is interested in water birth.  MyChart/Babyscripts MyChart access verified. I explained pt will have some visits in office and some virtually. Babyscripts instructions given and order placed. Patient verifies receipt of registration text/e-mail. Account successfully created and app downloaded. If patient is a candidate for Optimized scheduling, add to sticky note.   Blood Pressure Cuff/Weight Scale Blood pressure cuff ordered for patient to pick-up from [employer]. Explained  after first prenatal appt pt will check weekly and document in Babyscripts. Patient does not have weight scale; patient may purchase if they desire to track weight weekly in Babyscripts.  Anatomy US  Explained first scheduled US  will be around 19 weeks. Anatomy US  scheduled for TBD at TBD.  Is patient a candidate for Babyscripts Optimization? No, due to Risk Factors   First visit review I reviewed new OB appt with patient. Explained pt will be seen by Dr. Rudy at first visit. Discussed [place] genetic screening with patient. Requests Panorama and Horizon.. Routine prenatal labs collected at today's visit.   Last Pap Diagnosis  Date Value Ref Range Status  02/08/2020   Final   - Negative for intraepithelial lesion or malignancy (NILM)    Rocky Dana Ober, RN 08/17/2024  9:17 AM

## 2024-08-18 LAB — CBC/D/PLT+RPR+RH+ABO+RUBIGG...
Antibody Screen: NEGATIVE
Basophils Absolute: 0 x10E3/uL (ref 0.0–0.2)
Basos: 0 %
EOS (ABSOLUTE): 0.1 x10E3/uL (ref 0.0–0.4)
Eos: 2 %
HCV Ab: NONREACTIVE
HIV Screen 4th Generation wRfx: NONREACTIVE
Hematocrit: 33.4 % — ABNORMAL LOW (ref 34.0–46.6)
Hemoglobin: 9.7 g/dL — ABNORMAL LOW (ref 11.1–15.9)
Hepatitis B Surface Ag: NEGATIVE
Immature Grans (Abs): 0 x10E3/uL (ref 0.0–0.1)
Immature Granulocytes: 0 %
Lymphocytes Absolute: 1.4 x10E3/uL (ref 0.7–3.1)
Lymphs: 24 %
MCH: 21.9 pg — ABNORMAL LOW (ref 26.6–33.0)
MCHC: 29 g/dL — ABNORMAL LOW (ref 31.5–35.7)
MCV: 75 fL — ABNORMAL LOW (ref 79–97)
Monocytes Absolute: 0.2 x10E3/uL (ref 0.1–0.9)
Monocytes: 4 %
Neutrophils Absolute: 4 x10E3/uL (ref 1.4–7.0)
Neutrophils: 70 %
Platelets: 233 x10E3/uL (ref 150–450)
RBC: 4.43 x10E6/uL (ref 3.77–5.28)
RDW: 19.5 % — ABNORMAL HIGH (ref 11.7–15.4)
RPR Ser Ql: NONREACTIVE
Rh Factor: POSITIVE
Rubella Antibodies, IGG: 7.27 {index} (ref >0.99)
WBC: 5.6 x10E3/uL (ref 3.4–10.8)

## 2024-08-18 LAB — HCV INTERPRETATION

## 2024-08-18 LAB — HEMOGLOBIN A1C
Est. average glucose Bld gHb Est-mCnc: 103 mg/dL
Hgb A1c MFr Bld: 5.2 % (ref 4.8–5.6)

## 2024-08-19 LAB — CULTURE, OB URINE

## 2024-08-19 LAB — URINE CULTURE, OB REFLEX

## 2024-08-21 ENCOUNTER — Ambulatory Visit: Payer: Self-pay | Admitting: Obstetrics and Gynecology

## 2024-08-23 LAB — PANORAMA PRENATAL TEST FULL PANEL:PANORAMA TEST PLUS 5 ADDITIONAL MICRODELETIONS: FETAL FRACTION: 8.6

## 2024-09-04 ENCOUNTER — Ambulatory Visit (INDEPENDENT_AMBULATORY_CARE_PROVIDER_SITE_OTHER): Admitting: Obstetrics

## 2024-09-04 ENCOUNTER — Other Ambulatory Visit (HOSPITAL_COMMUNITY)
Admission: RE | Admit: 2024-09-04 | Discharge: 2024-09-04 | Disposition: A | Discharge status: Home / Self Care | Source: Ambulatory Visit | Attending: Obstetrics | Admitting: Obstetrics

## 2024-09-04 VITALS — BP 124/82 | HR 96 | Wt 189.8 lb

## 2024-09-04 DIAGNOSIS — Z3A13 13 weeks gestation of pregnancy: Secondary | ICD-10-CM

## 2024-09-04 DIAGNOSIS — N898 Other specified noninflammatory disorders of vagina: Secondary | ICD-10-CM

## 2024-09-04 DIAGNOSIS — O0990 Supervision of high risk pregnancy, unspecified, unspecified trimester: Secondary | ICD-10-CM | POA: Diagnosis present

## 2024-09-04 DIAGNOSIS — F332 Major depressive disorder, recurrent severe without psychotic features: Secondary | ICD-10-CM

## 2024-09-04 DIAGNOSIS — O09899 Supervision of other high risk pregnancies, unspecified trimester: Secondary | ICD-10-CM

## 2024-09-04 DIAGNOSIS — K5900 Constipation, unspecified: Secondary | ICD-10-CM

## 2024-09-04 DIAGNOSIS — O09529 Supervision of elderly multigravida, unspecified trimester: Secondary | ICD-10-CM | POA: Diagnosis not present

## 2024-09-04 DIAGNOSIS — O99612 Diseases of the digestive system complicating pregnancy, second trimester: Secondary | ICD-10-CM

## 2024-09-04 DIAGNOSIS — R45851 Suicidal ideations: Secondary | ICD-10-CM

## 2024-09-04 DIAGNOSIS — O99012 Anemia complicating pregnancy, second trimester: Secondary | ICD-10-CM

## 2024-09-04 MED ORDER — POLYETHYLENE GLYCOL 3350 17 G PO PACK
17.0000 g | PACK | Freq: Every day | ORAL | 11 refills | Status: AC
Start: 2024-09-04 — End: ?

## 2024-09-04 MED ORDER — DOCUSATE SODIUM 100 MG PO CAPS: 100.0000 mg | ORAL_CAPSULE | Freq: Two times a day (BID) | ORAL | 11 refills | Status: AC

## 2024-09-04 MED ORDER — ACCRUFER 30 MG PO CAPS: 1.0000 | ORAL_CAPSULE | Freq: Two times a day (BID) | ORAL | 3 refills | Status: AC

## 2024-09-04 MED ORDER — CLINDAMYCIN HCL 300 MG PO CAPS: 300.0000 mg | ORAL_CAPSULE | Freq: Three times a day (TID) | ORAL | 0 refills | Status: DC

## 2024-09-04 NOTE — Progress Notes (Signed)
 Pt presents for new ob. Pt complains of constipation and headaches. No other questions or concerns at this time.

## 2024-09-04 NOTE — Progress Notes (Addendum)
 Subjective:    Dana [name] is being seen today for her first obstetrical visit.  This is not a planned pregnancy. She is at [redacted]w[redacted]d gestation. Her obstetrical history is significant for miscarriage,preterm delivery, anemia and depression. Relationship with FOB: significant other, living together. Patient does intend to breast feed. Pregnancy history fully reviewed.  The information documented in the HPI was reviewed and verified.  Menstrual History: OB History     Gravida  4   Para  2   Term  1   Preterm  1   AB  1   Living  2      SAB  1   IAB  0   Ectopic  0   Multiple  0   Live Births  2        Obstetric Comments  PPROM #3          Patient's last menstrual period was 05/31/2024 (exact date).    Past Medical History:  Diagnosis Date   Preterm labor     Past Surgical History:  Procedure Laterality Date   BREAST REDUCTION SURGERY  2006    (Not in a hospital admission)  No Known Allergies  Social History   Tobacco Use   Smoking status: Former    Current packs/day: 0.00    Types: Cigarettes    Quit date: 2020    Years since quitting: 5.7   Smokeless tobacco: Never   Tobacco comments:    Quit smoking 2019  Substance Use Topics   Alcohol use: No    Family History  Problem Relation Age of Onset   Diabetes Mother        11/2022 diabetic coma   Healthy Father      Review of Systems Constitutional: negative for weight loss Gastrointestinal:  positive for constipation.  negative for nausea and vomiting Genitourinary:negative for genital lesions and vaginal discharge and dysuria Musculoskeletal:negative for back pain Behavioral/Psych:  positive for depression, suicidal ideation..  negative for abusive relationship, and tobacco use and illegal drug use    Objective:    BP 124/82   Pulse 96   Wt 189 lb 12.8 oz (86.1 kg)   LMP 05/31/2024 (Exact Date)   BMI 33.62 kg/m  General Appearance:    Alert, cooperative, no distress, appears stated  age  Head:    Normocephalic, without obvious abnormality, atraumatic  Eyes:    PERRL, conjunctiva/corneas clear, EOM's intact, fundi    benign, both eyes  Ears:    Normal TM's and external ear canals, both ears  Nose:   Nares normal, septum midline, mucosa normal, no drainage    or sinus tenderness  Throat:   Lips, mucosa, and tongue normal; teeth and gums normal  Neck:   Supple, symmetrical, trachea midline, no adenopathy;    thyroid:  no enlargement/tenderness/nodules; no carotid   bruit or JVD  Back:     Symmetric, no curvature, ROM normal, no CVA tenderness  Lungs:     Clear to auscultation bilaterally, respirations unlabored  Chest Wall:    No tenderness or deformity   Heart:    Regular rate and rhythm, S1 and S2 normal, no murmur, rub   or gallop  Breast Exam:    No tenderness, masses, or nipple abnormality  Abdomen:     Soft, non-tender, bowel sounds active all four quadrants,    no masses, no organomegaly  Genitalia:    Normal female without lesion, discharge or tenderness  Extremities:   Extremities normal,  atraumatic, no cyanosis or edema  Pulses:   2+ and symmetric all extremities  Skin:   Skin color, texture, turgor normal, no rashes or lesions  Lymph nodes:   Cervical, supraclavicular, and axillary nodes normal  Neurologic:   CNII-XII intact, normal strength, sensation and reflexes    throughout      Lab Review Urine pregnancy test Labs reviewed yes Radiologic studies reviewed yes  Assessment:    Pregnancy at [redacted]w[redacted]d weeks    Plan:    1. Supervision of high risk pregnancy, antepartum (Primary) Rx: - Cytology - PAP( [place]) - Cervicovaginal ancillary only( [place])  2. AMA (advanced maternal age) multigravida 35+, unspecified trimester  3. History of preterm delivery, currently pregnant  4. Anemia affecting pregnancy in second trimester Rx: - Ferric Maltol  (ACCRUFER ) 30 MG CAPS; Take 1 capsule (30 mg total) by mouth 2 (two) times daily before a meal.  Take 2 hrs before, or 2 hrs after a meal.  Dispense: 60 capsule; Refill: 3  5. Vaginal discharge Rx: - clindamycin  (CLEOCIN ) 300 MG capsule; Take 1 capsule (300 mg total) by mouth 3 (three) times daily.  Dispense: 21 capsule; Refill: 0  6. Constipation during pregnancy in second trimester Rx: - docusate sodium  (COLACE) 100 MG capsule; Take 1 capsule (100 mg total) by mouth 2 (two) times daily.  Dispense: 60 capsule; Refill: 11 - polyethylene glycol (MIRALAX ) 17 g packet; Take 17 g by mouth at bedtime.  Dispense: 30 each; Refill: 11  7. MDD (major depressive disorder), recurrent severe, without psychosis (HCC) - clinicallystable  8. Suicide ideation - clinically stable    Prenatal vitamins.  Counseling provided regarding continued use of seat belts, cessation of alcohol consumption, smoking or use of illicit drugs; infection precautions i.e., influenza/TDAP immunizations, toxoplasmosis,CMV, parvovirus, listeria and varicella; workplace safety, exercise during pregnancy; routine dental care, safe medications, sexual activity, hot tubs, saunas, pools, travel, caffeine use, fish and methlymercury, potential toxins, hair treatments, varicose veins Weight gain recommendations per IOM guidelines reviewed: underweight/BMI< 18.5--> gain 28 - 40 lbs; normal weight/BMI 18.5 - 24.9--> gain 25 - 35 lbs; overweight/BMI 25 - 29.9--> gain 15 - 25 lbs; obese/BMI >30->gain  11 - 20 lbs Problem list reviewed and updated. FIRST/CF mutation testing/NIPT/QUAD SCREEN/fragile X/Ashkenazi Jewish population testing/Spinal muscular atrophy discussed: requested. Role of ultrasound in pregnancy discussed; fetal survey: requested. Amniocentesis discussed: declined.  Meds ordered this encounter  Medications   Ferric Maltol  (ACCRUFER ) 30 MG CAPS    Sig: Take 1 capsule (30 mg total) by mouth 2 (two) times daily before a meal. Take 2 hrs before, or 2 hrs after a meal.    Dispense:  60 capsule    Refill:  3   docusate  sodium (COLACE) 100 MG capsule    Sig: Take 1 capsule (100 mg total) by mouth 2 (two) times daily.    Dispense:  60 capsule    Refill:  11   polyethylene glycol (MIRALAX ) 17 g packet    Sig: Take 17 g by mouth at bedtime.    Dispense:  30 each    Refill:  11   clindamycin  (CLEOCIN ) 300 MG capsule    Sig: Take 1 capsule (300 mg total) by mouth 3 (three) times daily.    Dispense:  21 capsule    Refill:  0    Follow up in 3 weeks.  I have spent a total of 25 minutes of face-to-face time, excluding clinical staff time, reviewing notes and preparing to see  patient, ordering tests and/or medications, and counseling the patient.   CARLIN RONAL CENTERS, MD, FACOG Attending Obstetrician & Gynecologist, [facility] for [facility], [facility], [place] 09/04/2024

## 2024-09-05 LAB — CERVICOVAGINAL ANCILLARY ONLY
Bacterial Vaginitis (gardnerella): POSITIVE — AB
Candida Glabrata: NEGATIVE
Candida Vaginitis: NEGATIVE
Chlamydia: NEGATIVE
Comment: NEGATIVE
Comment: NEGATIVE
Comment: NEGATIVE
Comment: NEGATIVE
Comment: NEGATIVE
Comment: NORMAL
Neisseria Gonorrhea: NEGATIVE
Trichomonas: NEGATIVE

## 2024-09-06 ENCOUNTER — Ambulatory Visit: Payer: Self-pay | Admitting: Obstetrics

## 2024-09-11 LAB — CYTOLOGY - PAP
Comment: NEGATIVE
Diagnosis: REACTIVE
High risk HPV: NEGATIVE

## 2024-09-25 ENCOUNTER — Encounter: Payer: Self-pay | Admitting: Obstetrics

## 2024-09-25 ENCOUNTER — Ambulatory Visit (INDEPENDENT_AMBULATORY_CARE_PROVIDER_SITE_OTHER): Admitting: Obstetrics

## 2024-09-25 VITALS — BP 121/80 | HR 97 | Wt 194.1 lb

## 2024-09-25 DIAGNOSIS — O99012 Anemia complicating pregnancy, second trimester: Secondary | ICD-10-CM

## 2024-09-25 DIAGNOSIS — R45851 Suicidal ideations: Secondary | ICD-10-CM | POA: Diagnosis not present

## 2024-09-25 DIAGNOSIS — O0990 Supervision of high risk pregnancy, unspecified, unspecified trimester: Secondary | ICD-10-CM

## 2024-09-25 NOTE — Progress Notes (Signed)
 Subjective:  Dana [name] is a 38 y.o. H5E8887 at [redacted]w[redacted]d being seen today for ongoing prenatal care.  She is currently monitored for the following issues for this high-risk pregnancy and has Preterm delivery, delivered; Anemia complicating pregnancy; MDD (major depressive disorder), recurrent severe, without psychosis (HCC); Suicide ideation; Miscarriage; and Supervision of high risk pregnancy, antepartum on their problem list.  Patient reports no complaints.  Contractions: Not present. Vag. Bleeding: None.  Movement: Present. Denies leaking of fluid.   The following portions of the patient's history were reviewed and updated as appropriate: allergies, current medications, past family history, past medical history, past social history, past surgical history and problem list. Problem list updated.  Objective:   Vitals:   09/25/24 1622  BP: 121/80  Pulse: 97  Weight: 194 lb 1.6 oz (88 kg)    Fetal Status: Fetal Heart Rate (bpm): 150   Movement: Present     General:  Alert, oriented and cooperative. Patient is in no acute distress.  Skin: Skin is warm and dry. No rash noted.   Cardiovascular: Normal heart rate noted  Respiratory: Normal respiratory effort, no problems with respiration noted  Abdomen: Soft, gravid, appropriate for gestational age. Pain/Pressure: Present (Sore when waking up; resolves during the day)     Pelvic:  Cervical exam deferred        Extremities: Normal range of motion.  Edema: None  Mental Status: Normal mood and affect. Normal behavior. Normal judgment and thought content.   Urinalysis:      Assessment and Plan:  Pregnancy: H5E8887 at [redacted]w[redacted]d  1. Supervision of high risk pregnancy, antepartum (Primary) Rx: - AFP, Serum, Open Spina Bifida  2. Preterm delivery, delivered  3. Anemia affecting pregnancy in second trimester  4. Suicide ideation - clinically stable  Preterm labor symptoms and general obstetric precautions including but not limited to vaginal  bleeding, contractions, leaking of fluid and fetal movement were reviewed in detail with the patient. Please refer to After Visit Summary for other counseling recommendations.   Return in about 4 weeks (around 10/23/2024) for [facility].   Rudy Carlin LABOR, MD 09/25/2024

## 2024-09-25 NOTE — Progress Notes (Signed)
 ROB. Safe medications list provided. C/O increased dryness on hands and face; hands peeling.

## 2024-09-27 LAB — AFP, SERUM, OPEN SPINA BIFIDA
AFP MoM: 1.22
AFP Value: 37.5 ng/mL
Gest. Age on Collection Date: 16 wk
Maternal Age At EDD: 37.7 a
OSBR Risk 1 IN: 10000
Test Results:: NEGATIVE
Weight: 194 [lb_av]

## 2024-10-16 ENCOUNTER — Ambulatory Visit

## 2024-10-16 ENCOUNTER — Other Ambulatory Visit: Payer: Self-pay | Admitting: *Deleted

## 2024-10-16 ENCOUNTER — Ambulatory Visit: Attending: Obstetrics and Gynecology | Admitting: Maternal & Fetal Medicine

## 2024-10-16 DIAGNOSIS — O09522 Supervision of elderly multigravida, second trimester: Secondary | ICD-10-CM | POA: Insufficient documentation

## 2024-10-16 DIAGNOSIS — O0990 Supervision of high risk pregnancy, unspecified, unspecified trimester: Secondary | ICD-10-CM

## 2024-10-16 DIAGNOSIS — Z363 Encounter for antenatal screening for malformations: Secondary | ICD-10-CM | POA: Insufficient documentation

## 2024-10-16 DIAGNOSIS — O321XX0 Maternal care for breech presentation, not applicable or unspecified: Secondary | ICD-10-CM | POA: Insufficient documentation

## 2024-10-16 DIAGNOSIS — O99212 Obesity complicating pregnancy, second trimester: Secondary | ICD-10-CM | POA: Diagnosis not present

## 2024-10-16 DIAGNOSIS — Z3A19 19 weeks gestation of pregnancy: Secondary | ICD-10-CM

## 2024-10-16 DIAGNOSIS — O09212 Supervision of pregnancy with history of pre-term labor, second trimester: Secondary | ICD-10-CM | POA: Diagnosis not present

## 2024-10-16 DIAGNOSIS — O09292 Supervision of pregnancy with other poor reproductive or obstetric history, second trimester: Secondary | ICD-10-CM | POA: Diagnosis not present

## 2024-10-16 DIAGNOSIS — Z8659 Personal history of other mental and behavioral disorders: Secondary | ICD-10-CM | POA: Diagnosis not present

## 2024-10-16 DIAGNOSIS — E669 Obesity, unspecified: Secondary | ICD-10-CM

## 2024-10-16 DIAGNOSIS — O09892 Supervision of other high risk pregnancies, second trimester: Secondary | ICD-10-CM | POA: Insufficient documentation

## 2024-10-16 DIAGNOSIS — O358XX0 Maternal care for other (suspected) fetal abnormality and damage, not applicable or unspecified: Secondary | ICD-10-CM

## 2024-10-16 NOTE — Progress Notes (Signed)
 Patient information  Patient Name: Dana [name]  Patient MRN:   [id number]  Referring practice: [facility] Referring Provider: [facility]  Problem List   Patient Active Problem List   Diagnosis Date Noted   Supervision of high risk pregnancy, antepartum 08/17/2024   MDD (major depressive disorder), recurrent severe, without psychosis (HCC) 09/16/2023   History of suicidal ideation 09/16/2023   Maternal Fetal Medicine Consult DEVON KINGDON is a 38 y.o. H5E8887 at [redacted]w[redacted]d here for ultrasound and consultation. She had low risk aneuploidy screening of a female fetus. Carrier screening was N/A. Maternal serum AFP was negative. She has no acute concerns.   Today we focused on the following:   Mood assessment: The patient reports that her mood is doing well today.  She has a history of major depression and was on an SSRI which she believes to be Zoloft.  I discussed that she can take many medications for her mental health during pregnancy and if she feels like she needs this she should start it under the guidance of her OB provider or if she would like us  to assist we would be glad to as well.  I discussed that it takes often 4 to 8 weeks for an SSRI to take full effect and dose adjustments are needed.  Postpartum period is a significant time to experience a decline in mood.  For this reason she may want to consider starting medication in the late second trimester.  Currently she feels well and will contemplate whether she wants to start medication.   History of 32-week PPROM delivery: We discussed the chance of recurrent preterm birth is slightly higher compared to those without this and their history.  The cervical length appears normal today.  Since she has not had a delivery less than 28 weeks, serial cervical lengths are not indicated at this time. While vaginal progesterone can be offered due to its safety profile, it is unknown if it is effective in patients with a history of preterm birth  and a normal cervical length.  The patient was counseled about this option and declined.  Also discussed the option of serial cervical lengths and the patient also declined this in favor of monitoring her symptoms.  Since she had a late preterm birth I think this is a reasonable approach.  I discussed that the cardiac anatomy was difficult to assess today due to fetal position and she will return in approximately 5 weeks  Sonographic findings Single intrauterine pregnancy at 19w 5d  Fetal cardiac activity:  Observed and appears normal. Presentation: Breech. The anatomic structures that were well seen appear normal. Due to poor acoustic windows some structures remain suboptimally visualized. Fetal biometry shows the estimated fetal weight at the 60 percentile.  Amniotic fluid: Within normal limits.  MVP: 5.01 cm. Placenta: Anterior. Adnexa: No abnormality visualized. Cervical length: 3.9 cm.  Recommendations - EDD should be 03/07/2025 based on  LMP  (05/31/24). - Follow up ultrasound in 4-6 weeks to attempt visualization of the anatomy not seen and reassess the fetal growth.  Review of Systems: A review of systems was performed and was negative except per HPI   Past Obstetrical History:  OB History  Gravida Para Term Preterm AB Living  4 2 1 1 1 2   SAB IAB Ectopic Multiple Live Births  1 0 0 0 2    # Outcome Date GA Lbr Len/2nd Weight Sex Type Anes PTL Lv  4 Current  3 SAB 08/2023     SAB     2 Preterm 10/02/11 [redacted]w[redacted]d 04:45  M Vag-Spont None    1 Term 07/11/08    M Vag-Spont  N LIV    Obstetric Comments  PPROM #3     Past Medical History:  Past Medical History:  Diagnosis Date   Miscarriage 09/16/2023   Preterm labor      Past Surgical History:    Past Surgical History:  Procedure Laterality Date   BREAST REDUCTION SURGERY  2006     Home Medications:   Current Outpatient Medications on File Prior to Visit  Medication Sig Dispense Refill   acetaminophen   (TYLENOL ) 500 MG tablet Take 500 mg by mouth every 6 (six) hours as needed.     Blood Pressure Monitoring (BLOOD PRESSURE KIT) DEVI 1 Device by Does not apply route once a week. (Patient not taking: Reported on 09/25/2024) 1 each 0   calcium -vitamin D  (OSCAL WITH D) 500-200 MG-UNIT tablet Take 1 tablet by mouth 2 (two) times daily. (Patient not taking: Reported on 09/25/2024) 60 tablet 11   clindamycin  (CLEOCIN ) 300 MG capsule Take 1 capsule (300 mg total) by mouth 3 (three) times daily. 21 capsule 0   docusate sodium  (COLACE) 100 MG capsule Take 1 capsule (100 mg total) by mouth 2 (two) times daily. 60 capsule 11   Doxylamine -Pyridoxine  (DICLEGIS ) 10-10 MG TBEC Take 2 tablets by mouth at bedtime. If symptoms persist, add one tablet in the morning and one in the afternoon (Patient not taking: Reported on 09/25/2024) 100 tablet 5   Ferric Maltol  (ACCRUFER ) 30 MG CAPS Take 1 capsule (30 mg total) by mouth 2 (two) times daily before a meal. Take 2 hrs before, or 2 hrs after a meal. 60 capsule 3   hydrOXYzine  (ATARAX ) 25 MG tablet Take 1 tablet (25 mg total) by mouth every 8 (eight) hours as needed for anxiety. (Patient not taking: Reported on 09/25/2024) 30 tablet 0   polyethylene glycol (MIRALAX ) 17 g packet Take 17 g by mouth at bedtime. 30 each 11   Prenatal Vit-Fe Phos-FA-Omega (VITAFOL  GUMMIES) 3.33-0.333-34.8 MG CHEW Chew 3 tablets by mouth daily. 90 tablet 11   scopolamine  (TRANSDERM-SCOP) 1 MG/3DAYS Place 1 patch (1.5 mg total) onto the skin every 3 (three) days. (Patient not taking: Reported on 09/25/2024) 10 patch 12   No current facility-administered medications on file prior to visit.      Allergies:   No Known Allergies   Physical Exam:   There were no vitals filed for this visit. Sitting comfortably on the sonogram table Nonlabored breathing Normal rate and rhythm Abdomen is nontender  Thank you for the opportunity to be involved with this patient's care. Please let us  know if we can  be of any further assistance.   60 minutes of time was spent reviewing the patient's chart including labs, imaging and documentation.  At least 50% of this time was spent with direct patient care discussing the diagnosis, management and prognosis of her care.  [name] MFM, [place]   10/16/2024  1:21 PM

## 2024-10-24 ENCOUNTER — Encounter: Payer: Self-pay | Admitting: Obstetrics

## 2024-10-24 ENCOUNTER — Ambulatory Visit (INDEPENDENT_AMBULATORY_CARE_PROVIDER_SITE_OTHER): Admitting: Obstetrics

## 2024-10-24 VITALS — BP 122/76 | HR 93 | Wt 199.6 lb

## 2024-10-24 DIAGNOSIS — O09292 Supervision of pregnancy with other poor reproductive or obstetric history, second trimester: Secondary | ICD-10-CM

## 2024-10-24 DIAGNOSIS — O99712 Diseases of the skin and subcutaneous tissue complicating pregnancy, second trimester: Secondary | ICD-10-CM

## 2024-10-24 DIAGNOSIS — O0990 Supervision of high risk pregnancy, unspecified, unspecified trimester: Secondary | ICD-10-CM | POA: Diagnosis not present

## 2024-10-24 DIAGNOSIS — F332 Major depressive disorder, recurrent severe without psychotic features: Secondary | ICD-10-CM

## 2024-10-24 DIAGNOSIS — Z8659 Personal history of other mental and behavioral disorders: Secondary | ICD-10-CM

## 2024-10-24 DIAGNOSIS — L299 Pruritus, unspecified: Secondary | ICD-10-CM

## 2024-10-24 DIAGNOSIS — O09892 Supervision of other high risk pregnancies, second trimester: Secondary | ICD-10-CM | POA: Diagnosis not present

## 2024-10-24 DIAGNOSIS — R002 Palpitations: Secondary | ICD-10-CM

## 2024-10-24 MED ORDER — URSODIOL 300 MG PO CAPS: 300.0000 mg | ORAL_CAPSULE | Freq: Two times a day (BID) | ORAL | 5 refills | Status: AC

## 2024-10-24 MED ORDER — HYDROXYZINE HCL 10 MG PO TABS: 10.0000 mg | ORAL_TABLET | Freq: Three times a day (TID) | ORAL | 0 refills | Status: DC | PRN

## 2024-10-24 NOTE — Progress Notes (Unsigned)
 Subjective:  Dana [name] is a 38 y.o. H5E8887 at [redacted]w[redacted]d being seen today for ongoing prenatal care.  She is currently monitored for the following issues for this {Blank single:19197::high-risk,low-risk} pregnancy and has MDD (major depressive disorder), recurrent severe, without psychosis (HCC); History of suicidal ideation; Supervision of high risk pregnancy, antepartum; History of preterm delivery, currently pregnant in second trimester; and History of preterm PROM in previous pregnancy, currently pregnant, second trimester on their problem list.  Patient reports {sx:14538}.  Contractions: Not present. Vag. Bleeding: None.  Movement: Present. Denies leaking of fluid.   The following portions of the patient's history were reviewed and updated as appropriate: allergies, current medications, past family history, past medical history, past social history, past surgical history and problem list. Problem list updated.  Objective:   Vitals:   10/24/24 1615  BP: 122/76  Pulse: 93  Weight: 199 lb 9.6 oz (90.5 kg)    Fetal Status: Fetal Heart Rate (bpm): 142   Movement: Present     General:  Alert, oriented and cooperative. Patient is in no acute distress.  Skin: Skin is warm and dry. No rash noted.   Cardiovascular: Normal heart rate noted  Respiratory: Normal respiratory effort, no problems with respiration noted  Abdomen: Soft, gravid, appropriate for gestational age. Pain/Pressure: Present     Pelvic:  {Blank single:19197::Cervical exam performed,Cervical exam deferred}        Extremities: Normal range of motion.  Edema: Trace  Mental Status: Normal mood and affect. Normal behavior. Normal judgment and thought content.   Urinalysis:      Assessment and Plan:  Pregnancy: H5E8887 at [redacted]w[redacted]d  1. Pruritus of pregnancy in second trimester (Primary) *** - hydrOXYzine  (ATARAX ) 10 MG tablet; Take 1 tablet (10 mg total) by mouth 3 (three) times daily as needed.  Dispense: 30 tablet;  Refill: 0 - ursodiol (ACTIGALL) 300 MG capsule; Take 1 capsule (300 mg total) by mouth 2 (two) times daily.  Dispense: 60 capsule; Refill: 5  {Blank single:19197::Term,Preterm} labor symptoms and general obstetric precautions including but not limited to vaginal bleeding, contractions, leaking of fluid and fetal movement were reviewed in detail with the patient. Please refer to After Visit Summary for other counseling recommendations.   Return in about 2 weeks (around 11/07/2024) for [facility].   Rudy Carlin LABOR, MD 10/24/2024

## 2024-10-24 NOTE — Progress Notes (Unsigned)
 Pt presents for ROB visit.  Pt c/o heart palpations/flutters at rest

## 2024-10-25 ENCOUNTER — Other Ambulatory Visit

## 2024-10-26 ENCOUNTER — Other Ambulatory Visit

## 2024-10-29 ENCOUNTER — Other Ambulatory Visit

## 2024-10-29 DIAGNOSIS — L299 Pruritus, unspecified: Secondary | ICD-10-CM

## 2024-10-29 DIAGNOSIS — O0990 Supervision of high risk pregnancy, unspecified, unspecified trimester: Secondary | ICD-10-CM

## 2024-10-31 LAB — COMPREHENSIVE METABOLIC PANEL WITH GFR
ALT: 24 IU/L (ref 0–32)
AST: 21 IU/L (ref 0–40)
Albumin: 3.8 g/dL — ABNORMAL LOW (ref 3.9–4.9)
Alkaline Phosphatase: 50 IU/L (ref 41–116)
BUN/Creatinine Ratio: 8 — ABNORMAL LOW (ref 9–23)
BUN: 4 mg/dL — ABNORMAL LOW (ref 6–20)
Bilirubin Total: <0.2 mg/dL (ref 0.0–1.2)
CO2: 22 mmol/L (ref 20–29)
Calcium: 8.8 mg/dL (ref 8.7–10.2)
Chloride: 100 mmol/L (ref 96–106)
Creatinine, Ser: 0.5 mg/dL — ABNORMAL LOW (ref 0.57–1.00)
Globulin, Total: 2.4 g/dL (ref 1.5–4.5)
Glucose: 95 mg/dL (ref 70–99)
Potassium: 2.9 mmol/L — ABNORMAL LOW (ref 3.5–5.2)
Sodium: 137 mmol/L (ref 134–144)
Total Protein: 6.2 g/dL (ref 6.0–8.5)
eGFR: 124 mL/min/1.73 (ref >59)

## 2024-10-31 LAB — BILE ACIDS, TOTAL: Bile Acids Total: 9.9 umol/L (ref 0.0–10.0)

## 2024-11-07 ENCOUNTER — Ambulatory Visit (INDEPENDENT_AMBULATORY_CARE_PROVIDER_SITE_OTHER): Admitting: Obstetrics and Gynecology

## 2024-11-07 ENCOUNTER — Encounter: Payer: Self-pay | Admitting: Obstetrics and Gynecology

## 2024-11-07 VITALS — BP 108/70 | HR 92 | Wt 200.0 lb

## 2024-11-07 DIAGNOSIS — O26642 Intrahepatic cholestasis of pregnancy, second trimester: Secondary | ICD-10-CM

## 2024-11-07 DIAGNOSIS — O0990 Supervision of high risk pregnancy, unspecified, unspecified trimester: Secondary | ICD-10-CM | POA: Diagnosis not present

## 2024-11-07 DIAGNOSIS — O09892 Supervision of other high risk pregnancies, second trimester: Secondary | ICD-10-CM

## 2024-11-07 DIAGNOSIS — Z8659 Personal history of other mental and behavioral disorders: Secondary | ICD-10-CM

## 2024-11-07 DIAGNOSIS — O26649 Intrahepatic cholestasis of pregnancy, unspecified trimester: Secondary | ICD-10-CM | POA: Insufficient documentation

## 2024-11-07 NOTE — Progress Notes (Signed)
 Please discuss latest labs and ?cholestasis.  Pt is taking Hydroxyzine  and Ursodiol, states still having itching (upper body).  Pt has Cardio appt on 12/5.

## 2024-11-07 NOTE — Progress Notes (Signed)
 PRENATAL VISIT NOTE  Subjective:  Dana [name] is a 38 y.o. H5E8887 at [redacted]w[redacted]d being seen today for ongoing prenatal care.  She is currently monitored for the following issues for this high-risk pregnancy and has MDD (major depressive disorder), recurrent severe, without psychosis (HCC); History of suicidal ideation; Supervision of high risk pregnancy, antepartum; History of preterm delivery, currently pregnant in second trimester; History of preterm PROM in previous pregnancy, currently pregnant, second trimester; and Cholestasis during pregnancy on their problem list.  Patient reports no complaints.  Contractions: Irritability. Vag. Bleeding: None.  Movement: Present. Denies leaking of fluid.   The following portions of the patient's history were reviewed and updated as appropriate: allergies, current medications, past family history, past medical history, past social history, past surgical history and problem list.   Objective:   Vitals:   11/07/24 0921  BP: 108/70  Pulse: 92  Weight: 200 lb (90.7 kg)    Fetal Status:  Fetal Heart Rate (bpm): 135 Fundal Height: 23 cm Movement: Present    General: Alert, oriented and cooperative. Patient is in no acute distress.  Skin: Skin is warm and dry. No rash noted.   Cardiovascular: Normal heart rate noted  Respiratory: Normal respiratory effort, no problems with respiration noted  Abdomen: Soft, gravid, appropriate for gestational age.  Pain/Pressure: Absent     Pelvic: Cervical exam deferred        Extremities: Normal range of motion.     Mental Status: Normal mood and affect. Normal behavior. Normal judgment and thought content.      09/04/2024    3:56 PM 08/17/2024    9:05 AM 10/24/2023   10:31 AM  Depression screen PHQ 2/9  Decreased Interest 0 0 0  Down, Depressed, Hopeless 0 0 0  PHQ - 2 Score 0 0 0  Altered sleeping 2 0 0  Tired, decreased energy 3 0 1  Change in appetite 2 0 0  Feeling bad or failure about yourself  0 0 0   Trouble concentrating 0 0 0  Moving slowly or fidgety/restless 0 0 0  Suicidal thoughts 0 0 0  PHQ-9 Score 7  0  1      Data saved with a previous flowsheet row definition        09/04/2024    3:56 PM 08/17/2024    9:06 AM 10/24/2023   10:34 AM  GAD 7 : Generalized Anxiety Score  Nervous, Anxious, on Edge 0 1 1  Control/stop worrying 0 0 0  Worry too much - different things 0 0 0  Trouble relaxing 1 0 0  Restless 0 0 1  Easily annoyed or irritable 1 1 0  Afraid - awful might happen 0 0 0  Total GAD 7 Score 2 2 2     Assessment and Plan:  Pregnancy: H5E8887 at [redacted]w[redacted]d 1. Supervision of high risk pregnancy, antepartum (Primary) Patient is doing well reporting continued pruritus Follow up anatomy ultrasound scheduled Flu vaccine today Third trimester labs and glucola next visit  2. History of preterm delivery, currently pregnant in second trimester Stable without symptoms  3. History of suicidal ideation Stable  4. Cholestasis during pregnancy in second trimester Continue Ursodil Discussed delivery by 37 weeks  Preterm labor symptoms and general obstetric precautions including but not limited to vaginal bleeding, contractions, leaking of fluid and fetal movement were reviewed in detail with the patient. Please refer to After Visit Summary for other counseling recommendations.   Return in about 4 weeks (  around 12/05/2024) for in person, ROB, High risk, 2 hr glucola next visit.  Future Appointments  Date Time Provider Department Center  11/20/2024 10:15 AM [facility] PROVIDER 1 [facility]  11/20/2024  3:00 PM [facility] US1 [facility]  11/23/2024  1:40 PM Tobb, Dub, DO [facility] None    Winton Felt, MD

## 2024-11-20 ENCOUNTER — Ambulatory Visit: Attending: Obstetrics and Gynecology

## 2024-11-20 ENCOUNTER — Ambulatory Visit: Admitting: Maternal & Fetal Medicine

## 2024-11-20 VITALS — BP 129/69

## 2024-11-20 DIAGNOSIS — O358XX0 Maternal care for other (suspected) fetal abnormality and damage, not applicable or unspecified: Secondary | ICD-10-CM | POA: Insufficient documentation

## 2024-11-20 DIAGNOSIS — O26642 Intrahepatic cholestasis of pregnancy, second trimester: Secondary | ICD-10-CM

## 2024-11-20 DIAGNOSIS — O0990 Supervision of high risk pregnancy, unspecified, unspecified trimester: Secondary | ICD-10-CM | POA: Diagnosis not present

## 2024-11-20 DIAGNOSIS — Z3A24 24 weeks gestation of pregnancy: Secondary | ICD-10-CM

## 2024-11-20 NOTE — Progress Notes (Signed)
 Patient information  Patient Name: Dana [name]  Patient MRN:   [id number]  Referring practice: [facility] Referring Provider: [facility]  Problem List   Patient Active Problem List   Diagnosis Date Noted   Cholestasis during pregnancy 11/07/2024   History of preterm delivery, currently pregnant in second trimester 10/16/2024   History of preterm PROM in previous pregnancy, currently pregnant, second trimester 10/16/2024   Supervision of high risk pregnancy, antepartum 08/17/2024   MDD (major depressive disorder), recurrent severe, without psychosis (HCC) 09/16/2023   History of suicidal ideation 09/16/2023    Maternal Fetal medicine Consult  Dana [name] is a 38 y.o. [id number] at 105w5d here for ultrasound and consultation. Dana [name] is doing well today with no acute concerns. Today we focused on the following:   The patient is seen today in consultation for intrahepatic cholestasis of pregnancy (ICP). She reports generalized pruritus that has improved with initiation of ursodiol  and hydroxyzine . Her most recent total bile acids are approximately 9 mol/L, consistent with mild ICP. I reviewed the maternal and fetal implications of cholestasis of pregnancy, including the association with preterm delivery, meconium passage, and, in more severe cases, an elevated risk of stillbirth; I reassured her that the elevated stillbirth risk is primarily associated with bile acids >=100 mol/L, while mild elevations such as hers carry a significantly lower risk. We discussed the typical trajectory of ICP, expected monitoring, and the plan for ongoing management throughout the remainder of pregnancy.  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 I reviewed the anatomy and clinical significance of this finding, including potential for postnatal airway or esophageal compression depending on the specific  branching pattern. A fetal echocardiogram is indicated for further characterization of the arch anatomy and to exclude associated intracardiac defects. We discussed the overall favorable prognosis for many cases of isolated right-sided aortic arch/vascular ring, expected perinatal management, and the need for postnatal evaluation by pediatric cardiology.  The patient voiced understanding of the discussion, and all questions were answered.  Recommendations -Continue Ursodiol  and hydroxazine -Repeat bile acids every month - the highest value (not most recent) should be used to manage the patient.  -Serial growth ultrasounds every 4-6 weeks until delivery -Antenatal testing to start around 32 weeks  -Delivery around 37-[redacted] weeks gestation.  Significant elevations in bile acids, elevated liver enzymes, intense pruritus or nonreassuring fetal testing are all indications to consider an earlier delivery around 37 weeks or sooner if indicated. - Fetal echo ordered at [facility] due to the concern for a vascular ring with a right sided aortic arch  I spent 30 minutes reviewing the patients chart, including labs and images as well as counseling the patient about her medical conditions. Greater than 50% of the time was spent in direct face-to-face patient counseling.  [name]  MFM, [place]   11/20/2024  3:44 PM   Review of Systems: A review of systems was performed and was negative except per HPI   Vitals and Physical Exam    11/20/2024    2:48 PM 11/07/2024    9:21 AM 10/24/2024    4:15 PM  Vitals with BMI  Weight  200 lbs 199 lbs 10 oz  Systolic 129 108 877  Diastolic 69 70 76  Pulse  92 93    Sitting comfortably on the sonogram table Nonlabored breathing Normal rate and rhythm Abdomen is nontender  Past pregnancies OB History  Gravida Para Term Preterm AB Living  4 2 1 1 1 2   SAB IAB Ectopic Multiple Live Births  1 0 0 0 2    # Outcome Date GA Lbr Len/2nd Weight Sex Type  Anes PTL Lv  4 Current           3 SAB 08/2023     SAB     2 Preterm 10/02/11 [redacted]w[redacted]d 04:45  M Vag-Spont None    1 Term 07/11/08    Dana Christell SAILOR [name]    Obstetric Comments  PPROM #3     Future Appointments  Date Time Provider Department Center  11/23/2024  1:40 PM Tobb, Dub, DO [facility] None  12/05/2024  8:50 AM [facility] None  12/05/2024 10:35 AM Delores Nidia CROME, FNP [facility] None

## 2024-11-22 ENCOUNTER — Encounter: Admitting: Obstetrics and Gynecology

## 2024-11-23 ENCOUNTER — Encounter: Payer: Self-pay | Admitting: Cardiology

## 2024-11-23 ENCOUNTER — Ambulatory Visit: Admitting: Cardiology

## 2024-11-23 VITALS — BP 138/70 | HR 101 | Ht 63.0 in | Wt 206.4 lb

## 2024-11-23 DIAGNOSIS — R002 Palpitations: Secondary | ICD-10-CM

## 2024-11-23 DIAGNOSIS — O0990 Supervision of high risk pregnancy, unspecified, unspecified trimester: Secondary | ICD-10-CM

## 2024-11-23 DIAGNOSIS — E611 Iron deficiency: Secondary | ICD-10-CM

## 2024-11-23 DIAGNOSIS — Z7689 Persons encountering health services in other specified circumstances: Secondary | ICD-10-CM

## 2024-11-23 DIAGNOSIS — R0609 Other forms of dyspnea: Secondary | ICD-10-CM

## 2024-11-23 MED ORDER — BLOOD PRESSURE KIT DEVI: 1.0000 | 0 refills | Status: AC

## 2024-11-23 NOTE — Progress Notes (Unsigned)
 Cardio-Obstetrics Clinic  New Evaluation  Date:  11/27/2024   ID:  KIYLA RINGLER, DOB [birth year], MRN [id number]  PCP:  Leonce Carola JINNY DEVONNA   [facility] Providers Cardiologist:  Dub Huntsman, DO  Electrophysiologist:  None       Referring MD: Rudy Carlin LABOR, MD   Chief Complaint:   History of Present Illness:    Dana [name] is a 39 y.o. female [[id number]] who is being seen today for the evaluation of  palpitations at the request of Rudy Carlin LABOR, MD.   Medical hx includes morbid obesity.   She presents with palpitations and shortness of breath during pregnancy.  Approximately one month ago, she experienced a sudden onset of palpitations while at rest in bed, described as a racing then fluttering heart lasting about 30 seconds, followed by a brief sensation that her heart stopped before returning to normal. She had no nausea or dizziness. This was a single episode and has not recurred. She had no prior palpitations before this pregnancy and is unaware of family heart problems.  She has significant shortness of breath with minimal exertion, such as walking at work, and  has noticed that she recently started to get short of breath at rest.  She denies prior similar symptoms, nausea, dizziness, or swelling beyond the ankles.  She has ankle swelling that began a few weeks ago and improves with compression socks. She takes iron pills for anemia, with hemoglobin 9.7 in August, and notes increased ice consumption.  She had no caffeine intake around the time of the palpitations.  She is currently pregnant with her fifth pregnancy and fourth child and works in a job with some physical activity.   Prior CV Studies Reviewed: The following studies were reviewed today:   Past Medical History:  Diagnosis Date   Anemia    Miscarriage 09/16/2023   Preterm labor     Past Surgical History:  Procedure Laterality Date   BREAST REDUCTION SURGERY  2006      OB History      Gravida  5   Para  3   Term  2   Preterm  1   AB  1   Living  3      SAB  1   IAB  0   Ectopic  0   Multiple  0   Live Births  3        Obstetric Comments  PPROM #3             Current Medications: Current Meds  Medication Sig   acetaminophen  (TYLENOL ) 500 MG tablet Take 500 mg by mouth every 6 (six) hours as needed.   docusate sodium  (COLACE) 100 MG capsule Take 1 capsule (100 mg total) by mouth 2 (two) times daily.   Ferric Maltol  (ACCRUFER ) 30 MG CAPS Take 1 capsule (30 mg total) by mouth 2 (two) times daily before a meal. Take 2 hrs before, or 2 hrs after a meal.   hydrOXYzine  (ATARAX ) 10 MG tablet Take 1 tablet (10 mg total) by mouth 3 (three) times daily as needed.   hydrOXYzine  (ATARAX ) 25 MG tablet Take 1 tablet (25 mg total) by mouth every 8 (eight) hours as needed for anxiety.   polyethylene glycol (MIRALAX ) 17 g packet Take 17 g by mouth at bedtime.   Prenatal Vit-Fe Phos-FA-Omega (VITAFOL  GUMMIES) 3.33-0.333-34.8 MG CHEW Chew 3 tablets by mouth daily.   ursodiol  (ACTIGALL ) 300 MG capsule Take 1 capsule (300  mg total) by mouth 2 (two) times daily.     Allergies:   Patient has no known allergies.   Social History   Socioeconomic History   Marital status: Single    Spouse name: Not on file   Number of children: Not on file   Years of education: Not on file   Highest education level: Not on file  Occupational History   Not on file  Tobacco Use   Smoking status: Former    Current packs/day: 0.00    Types: Cigarettes    Quit date: 2020    Years since quitting: 5.9   Smokeless tobacco: Never   Tobacco comments:    Quit smoking 2019  Vaping Use   Vaping status: Former   Devices: Stopped 2 weeks ago  Substance and Sexual Activity   Alcohol use: No   Drug use: No   Sexual activity: Yes  Other Topics Concern   Not on file  Social History Narrative   Not on file   Social Drivers of Health   Financial Resource Strain: Low Risk  (05/31/2023)   Received from [facility]   Overall Financial Resource Strain (CARDIA)    Difficulty of Paying Living Expenses: Not very hard  Food Insecurity: No Food Insecurity (05/31/2023)   Received from [facility]   Hunger Vital Sign    Within the past 12 months, you worried that your food would run out before you got the money to buy more.: Never true    Within the past 12 months, the food you bought just didn't last and you didn't have money to get more.: Never true  Transportation Needs: No Transportation Needs (05/31/2023)   Received from [facility] - Transportation    Lack of Transportation (Medical): No    Lack of Transportation (Non-Medical): No  Physical Activity: Not on file  Stress: Not on file  Social Connections: Unknown (05/03/2022)   Received from [facility]   Social Network    Social Network: Not on file      Family History  Problem Relation Age of Onset   Diabetes Mother        11/2022 diabetic coma   Healthy Father       ROS:   Please see the history of present illness.     All other systems reviewed and are negative.   Labs/EKG Reviewed:    EKG:   EKG was ordered today.  The ekg ordered today demonstrates sinus tachycardia   Recent Labs: 08/17/2024: Hemoglobin 9.7; Platelets 233 10/29/2024: ALT 24; BUN 4; Creatinine, Ser 0.50; Potassium 2.9; Sodium 137   Recent Lipid Panel Lab Results  Component Value Date/Time   CHOL 134 03/12/2021 11:24 AM   TRIG 40 03/12/2021 11:24 AM   HDL 47 03/12/2021 11:24 AM   CHOLHDL 2.9 03/12/2021 11:24 AM   LDLCALC 77 03/12/2021 11:24 AM    Physical Exam:    VS:  BP 138/70 (BP Location: Left Arm, Patient Position: Sitting, Cuff Size: Normal)   Pulse (!) 101   Ht 5' 3 (1.6 m)   Wt 206 lb 6.4 oz (93.6 kg)   LMP 05/31/2024 (Exact Date)   SpO2 98%   BMI 36.56 kg/m     Wt Readings from Last 3 Encounters:  11/27/24 208 lb 9.6 oz (94.6 kg)  11/23/24 206 lb 6.4 oz (93.6 kg)  11/07/24 200 lb  (90.7 kg)     GEN:  Well nourished, well developed in no acute  distress HEENT: Normal NECK: No JVD; No carotid bruits LYMPHATICS: No lymphadenopathy CARDIAC: RRR, no murmurs, rubs, gallops RESPIRATORY:  Clear to auscultation without rales, wheezing or rhonchi  ABDOMEN: Soft, non-tender, non-distended MUSCULOSKELETAL:  No edema; No deformity  SKIN: Warm and dry NEUROLOGIC:  Alert and oriented x 3 PSYCHIATRIC:  Normal affect    Risk Assessment/Risk Calculators:     CARPREG II Risk Prediction Index Score:  1.  The patient's risk for a primary cardiac event is 5%.   Modified World Health Organization [facility]) Classification of Maternal CV Risk   Class I         ASSESSMENT & PLAN:    Heart palpitations during pregnancy Intermittent palpitations possibly due to PVCs, no recurrence since initial episode, EKG performed. - will monitor closely - if persist will place a monitor on the patient.  Advised to report any recurrence of palpitations. Will get cmp and mag  Elevated blood pressure Blood pressure at 138/70 mmHg, borderline hypertension in pregnancy, monitoring required. - Instructed to monitor blood pressure daily at home. - Instructed to upload blood pressure readings by December 15th, 2025. - Scheduled follow-up appointment in 6-8 weeks.  Iron deficiency anemia complicating pregnancy Previous hemoglobin 9.7 g/dL, iron deficiency anemia, on iron supplementation. - Ordered complete blood count and iron panel to assess current iron status. - Continue iron supplementation. - Will evaluate need for iron infusions based on lab results.  - Pt meets eligibility for IMPACT study, discussed risks/benefits of the study  - Provided with information sheet  - Aware that the research team will be in contact within 24-48hrs, followed by the Lead CHW    Patient Instructions  Medication Instructions:  Your physician recommends that you continue on your current medications as directed.  Please refer to the Current Medication list given to you today.  *If you need a refill on your cardiac medications before your next appointment, please call your pharmacy*  Lab Work: CBC, Iron panel, CMET, Mag Address: [address] Location: 1st floor (beside pharmacy)  Hours: 8am-4:30p, no appointment is needed  If you have labs (blood work) drawn today and your tests are completely normal, you will receive your results only by: MyChart Message (if you have MyChart) OR A paper copy in the mail If you have any lab test that is abnormal or we need to change your treatment, we will call you to review the results.  Testing/Procedures: Your physician has requested that you have an echocardiogram. Echocardiography is a painless test that uses sound waves to create images of your heart. It provides your doctor with information about the size and shape of your heart and how well your heart's chambers and valves are working. This procedure takes approximately one hour. There are no restrictions for this procedure. Please do NOT wear cologne, perfume, aftershave, or lotions (deodorant is allowed). Please arrive 15 minutes prior to your appointment time.  Please note: We ask at that you not bring children with you during ultrasound (echo/ vascular) testing. Due to room size and safety concerns, children are not allowed in the ultrasound rooms during exams. Our front office staff cannot provide observation of children in our lobby area while testing is being conducted. An adult accompanying a patient to their appointment will only be allowed in the ultrasound room at the discretion of the ultrasound technician under special circumstances. We apologize for any inconvenience.   Follow-Up: At [facility], you and your health needs are our priority.  As  part of our continuing mission to provide you with exceptional heart care, our providers are all part of one team.  This team includes your  primary Cardiologist (physician) and Advanced Practice Providers or APPs (Physician Assistants and Nurse Practitioners) who all work together to provide you with the care you need, when you need it.  Your next appointment:   6-8 week(s)  Provider:   Kaydince Towles, DO    Other Instructions Please take your blood pressure daily send in a MyChart message on 12/15. Please include heart rates. (Please only send one message of all readings).   HOW TO TAKE YOUR BLOOD PRESSURE: Rest 5 minutes before taking your blood pressure. Don't smoke or drink caffeinated beverages for at least 30 minutes before. Take your blood pressure before (not after) you eat. Sit comfortably with your back supported and both feet on the floor (don't cross your legs). Elevate your arm to heart level on a table or a desk. Use the proper sized cuff. It should fit smoothly and snugly around your bare upper arm. There should be enough room to slip a fingertip under the cuff. The bottom edge of the cuff should be 1 inch above the crease of the elbow. Ideally, take 3 measurements at one sitting and record the average.            Dispo:  No follow-ups on file.   Medication Adjustments/Labs and Tests Ordered: Current medicines are reviewed at length with the patient today.  Concerns regarding medicines are outlined above.  Tests Ordered: Orders Placed This Encounter  Procedures   CBC   Iron, TIBC and Ferritin Panel   Comp Met (CMET)   Magnesium    EKG 12-Lead   ECHOCARDIOGRAM COMPLETE   Medication Changes: Meds ordered this encounter  Medications   Blood Pressure Monitoring (BLOOD PRESSURE KIT) DEVI    Sig: 1 Device by Does not apply route once a week.    Dispense:  1 each    Refill:  0

## 2024-11-23 NOTE — Patient Instructions (Addendum)
 Medication Instructions:  Your physician recommends that you continue on your current medications as directed. Please refer to the Current Medication list given to you today.  *If you need a refill on your cardiac medications before your next appointment, please call your pharmacy*  Lab Work: CBC, Iron panel, CMET, Mag Address: [address] Location: 1st floor (beside pharmacy)  Hours: 8am-4:30p, no appointment is needed  If you have labs (blood work) drawn today and your tests are completely normal, you will receive your results only by: MyChart Message (if you have MyChart) OR A paper copy in the mail If you have any lab test that is abnormal or we need to change your treatment, we will call you to review the results.  Testing/Procedures: Your physician has requested that you have an echocardiogram. Echocardiography is a painless test that uses sound waves to create images of your heart. It provides your doctor with information about the size and shape of your heart and how well your heart's chambers and valves are working. This procedure takes approximately one hour. There are no restrictions for this procedure. Please do NOT wear cologne, perfume, aftershave, or lotions (deodorant is allowed). Please arrive 15 minutes prior to your appointment time.  Please note: We ask at that you not bring children with you during ultrasound (echo/ vascular) testing. Due to room size and safety concerns, children are not allowed in the ultrasound rooms during exams. Our front office staff cannot provide observation of children in our lobby area while testing is being conducted. An adult accompanying a patient to their appointment will only be allowed in the ultrasound room at the discretion of the ultrasound technician under special circumstances. We apologize for any inconvenience.   Follow-Up: At [facility], you and your health needs are our priority.  As part of our continuing  mission to provide you with exceptional heart care, our providers are all part of one team.  This team includes your primary Cardiologist (physician) and Advanced Practice Providers or APPs (Physician Assistants and Nurse Practitioners) who all work together to provide you with the care you need, when you need it.  Your next appointment:   6-8 week(s)  Provider:   Kardie Tobb, DO    Other Instructions Please take your blood pressure daily send in a MyChart message on 12/15. Please include heart rates. (Please only send one message of all readings).   HOW TO TAKE YOUR BLOOD PRESSURE: Rest 5 minutes before taking your blood pressure. Don't smoke or drink caffeinated beverages for at least 30 minutes before. Take your blood pressure before (not after) you eat. Sit comfortably with your back supported and both feet on the floor (don't cross your legs). Elevate your arm to heart level on a table or a desk. Use the proper sized cuff. It should fit smoothly and snugly around your bare upper arm. There should be enough room to slip a fingertip under the cuff. The bottom edge of the cuff should be 1 inch above the crease of the elbow. Ideally, take 3 measurements at one sitting and record the average.

## 2024-11-27 ENCOUNTER — Encounter (HOSPITAL_COMMUNITY): Payer: Self-pay | Admitting: Obstetrics and Gynecology

## 2024-11-27 ENCOUNTER — Inpatient Hospital Stay (HOSPITAL_COMMUNITY)
Admission: AD | Admit: 2024-11-27 | Discharge: 2024-11-27 | Disposition: A | Discharge status: Home / Self Care | Attending: Obstetrics and Gynecology | Admitting: Obstetrics and Gynecology

## 2024-11-27 DIAGNOSIS — O0990 Supervision of high risk pregnancy, unspecified, unspecified trimester: Secondary | ICD-10-CM

## 2024-11-27 DIAGNOSIS — R102 Pelvic and perineal pain unspecified side: Secondary | ICD-10-CM | POA: Diagnosis not present

## 2024-11-27 DIAGNOSIS — O368120 Decreased fetal movements, second trimester, not applicable or unspecified: Secondary | ICD-10-CM | POA: Diagnosis not present

## 2024-11-27 DIAGNOSIS — O26892 Other specified pregnancy related conditions, second trimester: Secondary | ICD-10-CM | POA: Diagnosis not present

## 2024-11-27 DIAGNOSIS — Z3A25 25 weeks gestation of pregnancy: Secondary | ICD-10-CM | POA: Diagnosis not present

## 2024-11-27 HISTORY — DX: Anemia, unspecified: D64.9

## 2024-11-27 LAB — URINALYSIS, ROUTINE W REFLEX MICROSCOPIC
Bilirubin Urine: NEGATIVE
Glucose, UA: NEGATIVE mg/dL
Hgb urine dipstick: NEGATIVE
Ketones, ur: NEGATIVE mg/dL
Leukocytes,Ua: NEGATIVE
Nitrite: NEGATIVE
Protein, ur: NEGATIVE mg/dL
Specific Gravity, Urine: 1.008 (ref 1.005–1.030)
pH: 6 (ref 5.0–8.0)

## 2024-11-27 NOTE — MAU Note (Signed)
 Dana [name] is a 38 y.o. at [redacted]w[redacted]d here in MAU reporting decreased FM since last night. Pt reports feeling FM but not as much as usual. Also having some lower pelvic pain today and abdominal tightening. She has had some pelvic pain but worse today. Pain is worse with movement and walking.  LMP: na Onset of complaint: DFM since last night Pain score: 8 with walking and 6 with resting Vitals:   11/27/24 2118 11/27/24 2123  BP:  123/73  Pulse: 93   Resp: 17   Temp: 98.8 F (37.1 C)   SpO2: 100%      FHT: 137  Lab orders placed from triage: ua

## 2024-11-27 NOTE — MAU Provider Note (Signed)
 History     [id number]  Arrival date and time: 11/27/24 2112    Chief Complaint  Patient presents with   Pelvic Pain   Decreased Fetal Movement     HPI Dana [name] is a 38 y.o. at [redacted]w[redacted]d by 11 wk US  with PMHx notable for bile acids in this pregnancy, hx of PPROM with preterm delivery at 32 weeks in last pregnancy, MDD, who presents for decreased fetal movement and pelvic pressure.   Review of outside prenatal records from [facility] (in media tab): diagnosed with cholestasis last month, bile acids 9.9 and intense pruritus, currently on ursodiol .   Patient reports that fetal movement has been less than normal Usually baby is very active but has been noticeably less than usual However since being here fetal movement has been much more noticeable Feels like she has some intermittent braxton hicks but no painful contractions No leaking fluid or bleeding Was worried since she had preterm labor in last pregnancy in setting of PPROM  Has also felt really sore, especially in her pelvis and low back Pubic bone has been very sore as well as her tail bone Worse with movement, better laying down and resting   AB/Positive/-- (08/29 0946)  OB History     Gravida  5   Para  3   Term  2   Preterm  1   AB  1   Living  3      SAB  1   IAB  0   Ectopic  0   Multiple  0   Live Births  3        Obstetric Comments  PPROM #3         Past Medical History:  Diagnosis Date   Anemia    Miscarriage 09/16/2023   Preterm labor     Past Surgical History:  Procedure Laterality Date   BREAST REDUCTION SURGERY  2006    Family History  Problem Relation Age of Onset   Diabetes Mother        11/2022 diabetic coma   Healthy Father     Social History   Socioeconomic History   Marital status: Single    Spouse name: Not on file   Number of children: Not on file   Years of education: Not on file   Highest education level: Not on file  Occupational History    Not on file  Tobacco Use   Smoking status: Former    Current packs/day: 0.00    Types: Cigarettes    Quit date: 2020    Years since quitting: 5.9   Smokeless tobacco: Never   Tobacco comments:    Quit smoking 2019  Vaping Use   Vaping status: Former   Devices: Stopped 2 weeks ago  Substance and Sexual Activity   Alcohol use: No   Drug use: No   Sexual activity: Yes  Other Topics Concern   Not on file  Social History Narrative   Not on file   Social Drivers of Health   Financial Resource Strain: Low Risk (05/31/2023)   Received from [facility]   Overall Financial Resource Strain (CARDIA)    Difficulty of Paying Living Expenses: Not very hard  Food Insecurity: No Food Insecurity (05/31/2023)   Received from [facility]   Hunger Vital Sign    Within the past 12 months, you worried that your food would run out before you got the money to buy more.: Never true  Within the past 12 months, the food you bought just didn't last and you didn't have money to get more.: Never true  Transportation Needs: No Transportation Needs (05/31/2023)   Received from [facility]   PRAPARE - Transportation    Lack of Transportation (Medical): No    Lack of Transportation (Non-Medical): No  Physical Activity: Not on file  Stress: Not on file  Social Connections: Unknown (05/03/2022)   Received from [facility]   Social Network    Social Network: Not on file  Intimate Partner Violence: Unknown (03/26/2022)   Received from [facility]   HITS    Physically Hurt: Not on file    Insult or Talk Down To: Not on file    Threaten Physical Harm: Not on file    Scream or Curse: Not on file    No Known Allergies  No current facility-administered medications on file prior to encounter.   Current Outpatient Medications on File Prior to Encounter  Medication Sig Dispense Refill   acetaminophen  (TYLENOL ) 500 MG tablet Take 500 mg by mouth every 6 (six) hours as needed.     docusate sodium   (COLACE) 100 MG capsule Take 1 capsule (100 mg total) by mouth 2 (two) times daily. 60 capsule 11   Ferric Maltol  (ACCRUFER ) 30 MG CAPS Take 1 capsule (30 mg total) by mouth 2 (two) times daily before a meal. Take 2 hrs before, or 2 hrs after a meal. 60 capsule 3   hydrOXYzine  (ATARAX ) 10 MG tablet Take 1 tablet (10 mg total) by mouth 3 (three) times daily as needed. 30 tablet 0   polyethylene glycol (MIRALAX ) 17 g packet Take 17 g by mouth at bedtime. 30 each 11   Prenatal Vit-Fe Phos-FA-Omega (VITAFOL  GUMMIES) 3.33-0.333-34.8 MG CHEW Chew 3 tablets by mouth daily. 90 tablet 11   ursodiol  (ACTIGALL ) 300 MG capsule Take 1 capsule (300 mg total) by mouth 2 (two) times daily. 60 capsule 5   Blood Pressure Monitoring (BLOOD PRESSURE KIT) DEVI 1 Device by Does not apply route once a week. 1 each 0   hydrOXYzine  (ATARAX ) 25 MG tablet Take 1 tablet (25 mg total) by mouth every 8 (eight) hours as needed for anxiety. 30 tablet 0   scopolamine  (TRANSDERM-SCOP) 1 MG/3DAYS Place 1 patch (1.5 mg total) onto the skin every 3 (three) days. (Patient not taking: Reported on 11/23/2024) 10 patch 12     ROS Pertinent positives and negative per HPI, all others reviewed and negative  Physical Exam   BP 123/73   Pulse 93   Temp 98.8 F (37.1 C)   Resp 17   Ht 5' 3 (1.6 m)   Wt 94.6 kg   LMP 05/31/2024 (Exact Date)   SpO2 100%   BMI 36.95 kg/m   Patient Vitals for the past 24 hrs:  BP Temp Pulse Resp SpO2 Height Weight  11/27/24 2123 123/73 -- -- -- -- -- --  11/27/24 2118 -- 98.8 F (37.1 C) 93 17 100 % 5' 3 (1.6 m) 94.6 kg    Physical Exam Vitals reviewed.  Constitutional:      General: She is not in acute distress.    Appearance: She is well-developed. She is not diaphoretic.  Eyes:     General: No scleral icterus. Pulmonary:     Effort: Pulmonary effort is normal. No respiratory distress.  Abdominal:     General: There is no distension.     Palpations: Abdomen is soft.  Tenderness:  There is no abdominal tenderness. There is no guarding or rebound.  Genitourinary:    Comments: External os fingertip but internal os firmly closed, cervix long Skin:    General: Skin is warm and dry.  Neurological:     Mental Status: She is alert.     Coordination: Coordination normal.      Cervical Exam Dilation: Closed Exam by:: EKSTAT, MD  Bedside Ultrasound Not performed.  My interpretation: n/a  FHT Baseline: initially 145, then 135 bpm Variability: Good {> 6 bpm) Accelerations: Reactive Decelerations: Absent Uterine activity: None Cat: I  Labs Results for orders placed or performed during the hospital encounter of 11/27/24 (from the past 24 hours)  Urinalysis, Routine w reflex microscopic -Urine, Clean Catch     Status: Abnormal   Collection Time: 11/27/24  9:35 PM  Result Value Ref Range   Color, Urine STRAW (A) YELLOW   APPearance CLEAR CLEAR   Specific Gravity, Urine 1.008 1.005 - 1.030   pH 6.0 5.0 - 8.0   Glucose, UA NEGATIVE NEGATIVE mg/dL   Hgb urine dipstick NEGATIVE NEGATIVE   Bilirubin Urine NEGATIVE NEGATIVE   Ketones, ur NEGATIVE NEGATIVE mg/dL   Protein, ur NEGATIVE NEGATIVE mg/dL   Nitrite NEGATIVE NEGATIVE   Leukocytes,Ua NEGATIVE NEGATIVE    Imaging No results found.  MAU Course  Procedures Lab Orders         Urinalysis, Routine w reflex microscopic -Urine, Clean Catch    No orders of the defined types were placed in this encounter.  Imaging Orders  No imaging studies ordered today    MDM Moderate (Level 3-4)  Assessment and Plan  #Decreased fetal movement #[redacted] weeks gestation of pregnancy Resolved while in MAU, reactive NST.  #Pelvic pain in pregnancy, second trimester Cervix closed, no contractions on monitor, low suspicion for preterm labor. Characteristics more consistent with advancing gestational age.   #FWB FHT Cat I NST: Reactive   Dispo: discharged to home in stable condition    Dana CHRISTELLA Carolus,  MD/MPH 11/27/24 10:27 PM  Allergies as of 11/27/2024   No Known Allergies      Medication List     TAKE these medications    ACCRUFeR  30 MG Caps Generic drug: Ferric Maltol  Take 1 capsule (30 mg total) by mouth 2 (two) times daily before a meal. Take 2 hrs before, or 2 hrs after a meal.   acetaminophen  500 MG tablet Commonly known as: TYLENOL  Take 500 mg by mouth every 6 (six) hours as needed.   Blood Pressure Kit Devi 1 Device by Does not apply route once a week.   docusate sodium  100 MG capsule Commonly known as: Colace Take 1 capsule (100 mg total) by mouth 2 (two) times daily.   hydrOXYzine  25 MG tablet Commonly known as: ATARAX  Take 1 tablet (25 mg total) by mouth every 8 (eight) hours as needed for anxiety.   hydrOXYzine  10 MG tablet Commonly known as: ATARAX  Take 1 tablet (10 mg total) by mouth 3 (three) times daily as needed.   polyethylene glycol 17 g packet Commonly known as: MiraLax  Take 17 g by mouth at bedtime.   scopolamine  1 MG/3DAYS Commonly known as: TRANSDERM-SCOP Place 1 patch (1.5 mg total) onto the skin every 3 (three) days.   ursodiol  300 MG capsule Commonly known as: ACTIGALL  Take 1 capsule (300 mg total) by mouth 2 (two) times daily.   Vitafol  Gummies 3.33-0.333-34.8 MG Chew Chew 3 tablets by mouth daily.

## 2024-12-01 ENCOUNTER — Other Ambulatory Visit: Payer: Self-pay | Admitting: Obstetrics

## 2024-12-01 DIAGNOSIS — L299 Pruritus, unspecified: Secondary | ICD-10-CM

## 2024-12-03 ENCOUNTER — Encounter: Payer: Self-pay | Admitting: Cardiology

## 2024-12-05 ENCOUNTER — Ambulatory Visit: Payer: Self-pay | Admitting: Obstetrics and Gynecology

## 2024-12-05 ENCOUNTER — Other Ambulatory Visit

## 2024-12-05 ENCOUNTER — Encounter: Payer: Self-pay | Admitting: Obstetrics and Gynecology

## 2024-12-05 VITALS — BP 128/76 | HR 98 | Wt 203.8 lb

## 2024-12-05 DIAGNOSIS — O0990 Supervision of high risk pregnancy, unspecified, unspecified trimester: Secondary | ICD-10-CM | POA: Diagnosis not present

## 2024-12-05 DIAGNOSIS — O359XX0 Maternal care for (suspected) fetal abnormality and damage, unspecified, not applicable or unspecified: Secondary | ICD-10-CM | POA: Insufficient documentation

## 2024-12-05 DIAGNOSIS — O24410 Gestational diabetes mellitus in pregnancy, diet controlled: Secondary | ICD-10-CM

## 2024-12-05 DIAGNOSIS — O26642 Intrahepatic cholestasis of pregnancy, second trimester: Secondary | ICD-10-CM

## 2024-12-05 DIAGNOSIS — Z3A26 26 weeks gestation of pregnancy: Secondary | ICD-10-CM | POA: Diagnosis not present

## 2024-12-05 NOTE — Telephone Encounter (Signed)
 Blood pressure looks great no change in medicine  Dr Sheena 12/05/24

## 2024-12-05 NOTE — Progress Notes (Signed)
° °  PRENATAL VISIT NOTE  Subjective:  Dana [name] is a 38 y.o. [id number] at [redacted]w[redacted]d being seen today for ongoing prenatal care.  She is currently monitored for the following issues for this high-risk pregnancy and has MDD (major depressive disorder), recurrent severe, without psychosis (HCC); History of suicidal ideation; Supervision of high risk pregnancy, antepartum; History of preterm delivery, currently pregnant in second trimester; History of preterm PROM in previous pregnancy, currently pregnant, second trimester; and Cholestasis during pregnancy on their problem list.  Patient reports  itching.  Contractions: Irritability. Vag. Bleeding: None.  Movement: Present. Denies leaking of fluid.   The following portions of the patient's history were reviewed and updated as appropriate: allergies, current medications, past family history, past medical history, past social history, past surgical history and problem list.   Objective:    Vitals:   12/05/24 1056  BP: 128/76  Pulse: 98  Weight: 92.4 kg    Fetal Status:  Fetal Heart Rate (bpm): 143   Movement: Present    General: Alert, oriented and cooperative. Patient is in no acute distress.  Skin: Skin is warm and dry. No rash noted.   Cardiovascular: Normal heart rate noted  Respiratory: Normal respiratory effort, no problems with respiration noted  Abdomen: Soft, gravid, appropriate for gestational age.  Pain/Pressure: Absent     Pelvic: Cervical exam deferred        Extremities: Normal range of motion.  Edema: None  Mental Status: Normal mood and affect. Normal behavior. Normal judgment and thought content.   Assessment and Plan:  Pregnancy: [id number] at [redacted]w[redacted]d There are no diagnoses linked to this encounter.  1. Supervision of high risk pregnancy, antepartum (Primary) -Feeling well today. HR, BP, FHR appropriate. Vigorous fetal movement.   2. Cholestasis during pregnancy in second trimester -Itching is manageable, tolerated well  with meds - Bile acids to be rechecked in 2 weeks -Had CMP drawn with Dr. Sheena on 12/5 and liver enzymes were normal   3. [redacted] weeks gestation of pregnancy -gtt today -Offer Tdap next visit  4. Fetal abnormality affecting management of mother, single or unspecified fetus - US  Fetal Echocardiography; Future   Preterm labor symptoms and general obstetric precautions including but not limited to vaginal bleeding, contractions, leaking of fluid and fetal movement were reviewed in detail with the patient. Please refer to After Visit Summary for other counseling recommendations.   No follow-ups on file.  Future Appointments  Date Time Provider Department Center  12/24/2024 11:15 AM [facility] PROVIDER 1 [facility]  12/24/2024 11:30 AM [facility] US4 [facility]  01/04/2025  4:00 PM Pavero, Lonni, [facility] None  01/08/2025  8:00 AM [facility] 6 [facility]  01/11/2025  4:20 PM Tobb, Kardie, DO [facility] None   Rolin Amel, S-WHNP

## 2024-12-05 NOTE — Progress Notes (Signed)
 Pt presents for ROB visit. No concerns

## 2024-12-06 ENCOUNTER — Ambulatory Visit: Payer: Self-pay | Admitting: Obstetrics and Gynecology

## 2024-12-06 ENCOUNTER — Other Ambulatory Visit: Payer: Self-pay | Admitting: Obstetrics and Gynecology

## 2024-12-06 DIAGNOSIS — O24410 Gestational diabetes mellitus in pregnancy, diet controlled: Secondary | ICD-10-CM

## 2024-12-06 DIAGNOSIS — O24419 Gestational diabetes mellitus in pregnancy, unspecified control: Secondary | ICD-10-CM | POA: Insufficient documentation

## 2024-12-06 LAB — GLUCOSE TOLERANCE, 2 HOURS W/ 1HR
Glucose, 1 hour: 206 mg/dL — ABNORMAL HIGH (ref 70–179)
Glucose, 2 hour: 103 mg/dL (ref 70–152)
Glucose, Fasting: 92 mg/dL — ABNORMAL HIGH (ref 70–91)

## 2024-12-06 LAB — CBC
Hematocrit: 34.7 % (ref 34.0–46.6)
Hemoglobin: 9.9 g/dL — ABNORMAL LOW (ref 11.1–15.9)
MCH: 21.1 pg — ABNORMAL LOW (ref 26.6–33.0)
MCHC: 28.5 g/dL — ABNORMAL LOW (ref 31.5–35.7)
MCV: 74 fL — ABNORMAL LOW (ref 79–97)
Platelets: 257 x10E3/uL (ref 150–450)
RBC: 4.69 x10E6/uL (ref 3.77–5.28)
RDW: 20.9 % — ABNORMAL HIGH (ref 11.7–15.4)
WBC: 8 x10E3/uL (ref 3.4–10.8)

## 2024-12-06 LAB — HIV ANTIBODY (ROUTINE TESTING W REFLEX): HIV Screen 4th Generation wRfx: NONREACTIVE

## 2024-12-06 LAB — SYPHILIS: RPR W/REFLEX TO RPR TITER AND TREPONEMAL ANTIBODIES, TRADITIONAL SCREENING AND DIAGNOSIS ALGORITHM: RPR Ser Ql: NONREACTIVE

## 2024-12-06 MED ORDER — ACCU-CHEK SOFTCLIX LANCETS MISC: 100.0000 | Freq: Four times a day (QID) | 12 refills | Status: AC

## 2024-12-06 MED ORDER — ACCU-CHEK GUIDE W/DEVICE KIT: 1.0000 | PACK | Freq: Four times a day (QID) | 0 refills | Status: DC

## 2024-12-06 MED ORDER — ACCU-CHEK GUIDE TEST VI STRP: 1.0000 | ORAL_STRIP | Freq: Four times a day (QID) | 12 refills | Status: DC

## 2024-12-18 ENCOUNTER — Other Ambulatory Visit: Payer: Self-pay

## 2024-12-24 ENCOUNTER — Ambulatory Visit

## 2024-12-24 ENCOUNTER — Other Ambulatory Visit

## 2024-12-24 NOTE — Progress Notes (Unsigned)
 Patient was seen for Gestational Diabetes on ***  Start time *** and End time ***   Estimated due date: 03/07/2025; ***w***d  Interpreter from ***, Name:  ***, #***  Clinical: Medications: *** Medical History: *** Labs: OGTT fasting 92, 1 hour 206, 2 hour 103 on 12/05/2024  Lab Results  Component Value Date   HGBA1C 5.2 08/17/2024    Dietary and Lifestyle History:    Patient is here today ***. Patient would like to learn ***. Patient lives with ***.  *** shopping and cooking. Pt reports eating out *** times weekly.  Pt reports making the following changes including ***.  All Pt's questions were answered during this encounter.    History includes:  *** Medications include:  *** Labs noted:  ***   Physical Activity: *** Stress: *** Sleep: ***  24 hr Recall:  First Meal:  *** Snack:  *** Second meal:  *** Snack:  *** Third meal:  *** Snack:  *** Beverages:  ***  NUTRITION INTERVENTION  Nutrition education (E-1) on the following topics:   Initial Follow-up  []  []  Definition of Gestational Diabetes []  []  Why dietary management is important in controlling blood glucose Poorly controlled diabetes can lead to fetal macrosomia, shoulder dystocia and neurological injuries, stillbirth and neonatal complications including respiratory distress syndrome, hypoglycemia and prolonged NICU admission.  []  []  Effects each nutrient has on blood glucose levels []  []  Simple carbohydrates vs complex carbohydrates []  []  Fluid intake []  []  Creating a balanced meal plan []  []  Carbohydrate counting  []  []  When to check blood glucose levels []  []  Proper blood glucose monitoring techniques []  []  Effect of stress and stress reduction techniques  []  []  Exercise effect on blood glucose levels, appropriate exercise during pregnancy []  []  Importance of limiting caffeine and abstaining from alcohol and smoking []  []  Medications used for blood sugar control during  pregnancy []  []  Hypoglycemia and rule of 15 []  []  Postpartum self care  Blood glucose monitor given: *** Lot # *** Exp: *** CBG: *** mg/dL  *** Patient has a meter prior to visit. Patient is *** testing pre breakfast and 2 hours after each meal. FBS: *** Postprandial: ***  Patient instructed to monitor glucose levels: FBS: 60 - <= 95 mg/dL; 2 hour: <= 879 mg/dL  Patient received handouts: Nutrition Diabetes and Pregnancy Carbohydrate Counting List Blood glucose log Snack ideas for diabetes during pregnancy  Patient will be seen for follow-up as needed.

## 2024-12-26 ENCOUNTER — Telehealth: Payer: Self-pay | Admitting: Pharmacy Technician

## 2024-12-26 ENCOUNTER — Other Ambulatory Visit (HOSPITAL_COMMUNITY): Payer: Self-pay

## 2024-12-26 ENCOUNTER — Ambulatory Visit (INDEPENDENT_AMBULATORY_CARE_PROVIDER_SITE_OTHER): Payer: Self-pay | Admitting: Obstetrics and Gynecology

## 2024-12-26 VITALS — BP 106/78 | HR 98 | Wt 208.0 lb

## 2024-12-26 DIAGNOSIS — O26643 Intrahepatic cholestasis of pregnancy, third trimester: Secondary | ICD-10-CM

## 2024-12-26 DIAGNOSIS — O09893 Supervision of other high risk pregnancies, third trimester: Secondary | ICD-10-CM

## 2024-12-26 DIAGNOSIS — O09529 Supervision of elderly multigravida, unspecified trimester: Secondary | ICD-10-CM | POA: Insufficient documentation

## 2024-12-26 DIAGNOSIS — O09523 Supervision of elderly multigravida, third trimester: Secondary | ICD-10-CM | POA: Diagnosis not present

## 2024-12-26 DIAGNOSIS — O359XX0 Maternal care for (suspected) fetal abnormality and damage, unspecified, not applicable or unspecified: Secondary | ICD-10-CM

## 2024-12-26 DIAGNOSIS — Z6837 Body mass index (BMI) 37.0-37.9, adult: Secondary | ICD-10-CM | POA: Diagnosis not present

## 2024-12-26 DIAGNOSIS — O09892 Supervision of other high risk pregnancies, second trimester: Secondary | ICD-10-CM

## 2024-12-26 DIAGNOSIS — O0993 Supervision of high risk pregnancy, unspecified, third trimester: Secondary | ICD-10-CM

## 2024-12-26 DIAGNOSIS — Z3A29 29 weeks gestation of pregnancy: Secondary | ICD-10-CM | POA: Diagnosis not present

## 2024-12-26 DIAGNOSIS — O24410 Gestational diabetes mellitus in pregnancy, diet controlled: Secondary | ICD-10-CM | POA: Diagnosis not present

## 2024-12-26 DIAGNOSIS — O09293 Supervision of pregnancy with other poor reproductive or obstetric history, third trimester: Secondary | ICD-10-CM | POA: Diagnosis not present

## 2024-12-26 DIAGNOSIS — O0990 Supervision of high risk pregnancy, unspecified, unspecified trimester: Secondary | ICD-10-CM

## 2024-12-26 DIAGNOSIS — O09292 Supervision of pregnancy with other poor reproductive or obstetric history, second trimester: Secondary | ICD-10-CM

## 2024-12-26 MED ORDER — DEXCOM G7 SENSOR MISC: 1.0000 | Freq: Once | 5 refills | Status: AC

## 2024-12-26 NOTE — Progress Notes (Signed)
 "  PRENATAL VISIT NOTE  Subjective:  Dana [name] is a 39 y.o. [phone] at [redacted]w[redacted]d being seen today for ongoing prenatal care.  She is currently monitored for the following issues for this high-risk pregnancy and has MDD (major depressive disorder), recurrent severe, without psychosis (HCC); History of suicidal ideation; Supervision of high risk pregnancy, antepartum; History of preterm delivery, currently pregnant in second trimester; History of preterm PROM in previous pregnancy, currently pregnant, second trimester; Cholestasis during pregnancy; Fetal abnormality affecting management of mother; GDM (gestational diabetes mellitus); and Advanced maternal age in multigravida on their problem list.  Patient doing well with no acute concerns today. She reports possible return of itching/pruritis.  Contractions: Irregular. Vag. Bleeding: None.  Movement: Present. Denies leaking of fluid.   The following portions of the patient's history were reviewed and updated as appropriate: allergies, current medications, past family history, past medical history, past social history, past surgical history and problem list. Problem list updated.  Objective:   Vitals:   12/26/24 0943  BP: 106/78  Pulse: 98  Weight: 208 lb (94.3 kg)    Fetal Status: Fetal Heart Rate (bpm): 146   Movement: Present     General:  Alert, oriented and cooperative. Patient is in no acute distress.  Skin: Skin is warm and dry. No rash noted.   Cardiovascular: Normal heart rate noted  Respiratory: Normal respiratory effort, no problems with respiration noted  Abdomen: Soft, gravid, appropriate for gestational age.  Pain/Pressure: Present     Pelvic: Cervical exam deferred        Extremities: Normal range of motion.  Edema: Trace  Mental Status:  Normal mood and affect. Normal behavior. Normal judgment and thought content.   Assessment and Plan:  Pregnancy: [id number] at [redacted]w[redacted]d  1. [redacted] weeks gestation of pregnancy (Primary)  -  Continuous Glucose Sensor (DEXCOM G7 SENSOR) MISC; 1 Device by Does not apply route once for 1 dose. Place 1 sensor every 10 days. Discard old sensor after applying new sensor.  Dispense: 3 each; Refill: 5  2. Cholestasis during pregnancy in third trimester Pt continues with actigall .  Will recheck CMP and bile acids today Fetal testing at 32 weeks  - Bile acids, total - Comprehensive metabolic panel with GFR  3. Diet controlled gestational diabetes mellitus (GDM) in third trimester Pt says she id not have glucose reader and is concerned limited ability to check her blood sugars with traditional monitor Will try to get dexcom due to GDM, AMA and cholestasis, high risk pregnancy - Continuous Glucose Sensor (DEXCOM G7 SENSOR) MISC; 1 Device by Does not apply route once for 1 dose. Place 1 sensor every 10 days. Discard old sensor after applying new sensor.  Dispense: 3 each; Refill: 5  4. Supervision of high risk pregnancy, antepartum Continue routine prenatal care  - Tdap vaccine greater than or equal to 7yo IM - Continuous Glucose Sensor (DEXCOM G7 SENSOR) MISC; 1 Device by Does not apply route once for 1 dose. Place 1 sensor every 10 days. Discard old sensor after applying new sensor.  Dispense: 3 each; Refill: 5  5. History of preterm PROM in previous pregnancy, currently pregnant, second trimester No s/sx of PROM  6. History of preterm delivery, currently pregnant in second trimester No s/sx of preterm labor  7. Fetal abnormality affecting management of mother, single or unspecified fetus Vascular ring noted previously, pt has fetal echo on 01/11/25  8. Multigravida of advanced maternal age in third trimester   33.  BMI 37.0-37.9, adult  - Continuous Glucose Sensor (DEXCOM G7 SENSOR) MISC; 1 Device by Does not apply route once for 1 dose. Place 1 sensor every 10 days. Discard old sensor after applying new sensor.  Dispense: 3 each; Refill: 5  Preterm labor symptoms and general  obstetric precautions including but not limited to vaginal bleeding, contractions, leaking of fluid and fetal movement were reviewed in detail with the patient.  Please refer to After Visit Summary for other counseling recommendations.   Return in about 2 weeks (around 01/09/2025) for [facility], in person.   Jerilynn Buddle, MD Faculty Attending Center for [facility]   "

## 2024-12-26 NOTE — Telephone Encounter (Signed)
 Pharmacy Patient Advocate Encounter   Received notification from [facility] KEY that prior authorization for Dexcom G7 Sensor is required/requested.   Insurance verification completed.   The patient is insured through [employer].   Per test claim: PA required; PA submitted to above mentioned insurance via Latent Key/confirmation #/[id number] Status is pending

## 2024-12-27 ENCOUNTER — Other Ambulatory Visit

## 2024-12-27 ENCOUNTER — Other Ambulatory Visit (HOSPITAL_COMMUNITY): Payer: Self-pay

## 2024-12-27 DIAGNOSIS — O24410 Gestational diabetes mellitus in pregnancy, diet controlled: Secondary | ICD-10-CM

## 2024-12-27 NOTE — Telephone Encounter (Signed)
 Pharmacy Patient Advocate Encounter  Received notification from [facility] MEDICAID that Prior Authorization for Dexcom G7 Sensor has been APPROVED from 12/26/2024 to 06/25/2025. Ran test claim, Copay is $0.00. This test claim was processed through [facility]- copay amounts may vary at other pharmacies due to pharmacy/plan contracts, or as the patient moves through the different stages of their insurance plan.   PA #/Case ID/Reference #: [id number]

## 2024-12-28 ENCOUNTER — Ambulatory Visit: Payer: Self-pay | Admitting: Obstetrics and Gynecology

## 2024-12-28 LAB — COMPREHENSIVE METABOLIC PANEL WITH GFR
ALT: 7 IU/L (ref 0–32)
AST: 9 IU/L (ref 0–40)
Albumin: 3.7 g/dL — ABNORMAL LOW (ref 3.9–4.9)
Alkaline Phosphatase: 65 IU/L (ref 41–116)
BUN/Creatinine Ratio: 9 (ref 9–23)
BUN: 4 mg/dL — ABNORMAL LOW (ref 6–20)
Bilirubin Total: 0.3 mg/dL (ref 0.0–1.2)
CO2: 22 mmol/L (ref 20–29)
Calcium: 9.2 mg/dL (ref 8.7–10.2)
Chloride: 100 mmol/L (ref 96–106)
Creatinine, Ser: 0.43 mg/dL — ABNORMAL LOW (ref 0.57–1.00)
Globulin, Total: 2.7 g/dL (ref 1.5–4.5)
Glucose: 88 mg/dL (ref 70–99)
Potassium: 3.2 mmol/L — ABNORMAL LOW (ref 3.5–5.2)
Sodium: 137 mmol/L (ref 134–144)
Total Protein: 6.4 g/dL (ref 6.0–8.5)
eGFR: 128 mL/min/1.73

## 2024-12-28 LAB — BILE ACIDS, TOTAL: Bile Acids Total: 4.3 umol/L (ref 0.0–10.0)

## 2025-01-01 ENCOUNTER — Ambulatory Visit: Attending: Obstetrics and Gynecology | Admitting: Obstetrics and Gynecology

## 2025-01-01 ENCOUNTER — Other Ambulatory Visit: Payer: Self-pay | Admitting: *Deleted

## 2025-01-01 ENCOUNTER — Other Ambulatory Visit: Payer: Self-pay | Admitting: Maternal & Fetal Medicine

## 2025-01-01 ENCOUNTER — Ambulatory Visit

## 2025-01-01 VITALS — BP 120/69

## 2025-01-01 DIAGNOSIS — O26613 Liver and biliary tract disorders in pregnancy, third trimester: Secondary | ICD-10-CM | POA: Diagnosis not present

## 2025-01-01 DIAGNOSIS — O99213 Obesity complicating pregnancy, third trimester: Secondary | ICD-10-CM

## 2025-01-01 DIAGNOSIS — O289 Unspecified abnormal findings on antenatal screening of mother: Secondary | ICD-10-CM | POA: Diagnosis not present

## 2025-01-01 DIAGNOSIS — Z3A30 30 weeks gestation of pregnancy: Secondary | ICD-10-CM

## 2025-01-01 DIAGNOSIS — O0990 Supervision of high risk pregnancy, unspecified, unspecified trimester: Secondary | ICD-10-CM

## 2025-01-01 DIAGNOSIS — O24410 Gestational diabetes mellitus in pregnancy, diet controlled: Secondary | ICD-10-CM | POA: Insufficient documentation

## 2025-01-01 DIAGNOSIS — E669 Obesity, unspecified: Secondary | ICD-10-CM | POA: Diagnosis not present

## 2025-01-01 DIAGNOSIS — O358XX0 Maternal care for other (suspected) fetal abnormality and damage, not applicable or unspecified: Secondary | ICD-10-CM

## 2025-01-01 DIAGNOSIS — O09523 Supervision of elderly multigravida, third trimester: Secondary | ICD-10-CM

## 2025-01-01 DIAGNOSIS — O09213 Supervision of pregnancy with history of pre-term labor, third trimester: Secondary | ICD-10-CM | POA: Diagnosis not present

## 2025-01-01 DIAGNOSIS — Q2545 Double aortic arch: Secondary | ICD-10-CM

## 2025-01-01 DIAGNOSIS — O35BXX3 Maternal care for other (suspected) fetal abnormality and damage, fetal cardiac anomalies, fetus 3: Secondary | ICD-10-CM

## 2025-01-01 DIAGNOSIS — K831 Obstruction of bile duct: Secondary | ICD-10-CM | POA: Insufficient documentation

## 2025-01-01 DIAGNOSIS — Z369 Encounter for antenatal screening, unspecified: Secondary | ICD-10-CM | POA: Diagnosis present

## 2025-01-01 DIAGNOSIS — O26643 Intrahepatic cholestasis of pregnancy, third trimester: Secondary | ICD-10-CM

## 2025-01-01 NOTE — Progress Notes (Signed)
 Maternal-Fetal Medicine Consultation  Name: Dana [name]  MRN: [id number]  GA: [id number] [redacted]w[redacted]d   Patient is here for fetal growth assessment and antenatal testing. - Cholestasis in pregnancy.  Recent bile acid levels assessed to 6 days ago was within normal range.  Patient takes Actigall  300 mg twice daily. - Right aortic arch. - Gestational diabetes.  Reportedly well-controlled on diet. - Advanced maternal age.  On cell-free fetal DNA screening, the risks of fetal aneuploidies are not increased.  Low risk for 22 q11.2 deletion was noted. Obstetrical history significant for 3 term vaginal deliveries.  She did not have cholestasis in any of her previous pregnancies.  Ultrasound Normal amniotic fluid.  Fetal growth is appropriate for gestational age.  Breech presentation.  Antenatal testing is reassuring.  BPP 8/8. 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  Loose vascular ring is seen, and the trachea is not compressed.  Right aortic arch Patient has a fetal echocardiography appointment next week.  I encouraged her to keep her appointment.  Based on today's ultrasound finding, it appears that trachea is unlikely to be obstructed.  Therefore, the patient can have delivery at [facility] health.  We will, however, pediatric cardiologist's opinion.  I discussed the association of right aortic arch with 22 q11.2 deletion.  Cell free fetal DNA screening has limitations in detecting 22 q11.2 deletion.  I discussed the option of amniocentesis.  I described the procedure and the possible complication of preterm delivery (1 and 500 procedures).  Patient would like to wait till after birth and do chromosomal analysis on the baby.  I reassured the patient that there are no other obvious ultrasound features of 22 q11.2 deletion.  Cholestasis I counseled the patient that antenatal testing has limitations in predicting fetal compromise.  I discussed weekly antenatal testing till delivery.  Since  cholestasis is associated with increased risk of stillbirth (mostly at or after 39 weeks), we recommend delivery at [redacted] weeks gestation. Gestational diabetes was not addressed today.  Recommendations -NST in 1 and 2 weeks. - BPP in 3 weeks and then weekly till delivery. - Delivery at [redacted] weeks gestation.     Consultation including face-to-face (more than 50%) counseling 30 minutes.

## 2025-01-01 NOTE — Progress Notes (Signed)
 Patient reports that she checks blood glucose at home and readings are as follows: Fasting levels range from 65 to 85, and two hour post-meal reading ranges from 90.to 180.

## 2025-01-04 ENCOUNTER — Ambulatory Visit: Admitting: Pharmacist

## 2025-01-04 VITALS — BP 119/76 | HR 100

## 2025-01-04 DIAGNOSIS — Z3A31 31 weeks gestation of pregnancy: Secondary | ICD-10-CM | POA: Diagnosis not present

## 2025-01-04 DIAGNOSIS — O0990 Supervision of high risk pregnancy, unspecified, unspecified trimester: Secondary | ICD-10-CM

## 2025-01-04 NOTE — Patient Instructions (Signed)
 SABRA

## 2025-01-04 NOTE — Progress Notes (Unsigned)
 Patient ID: STUTI SANDIN                 DOB: 1986-04-11                      MRN: [id number]     HPI: Dana [name] is a 39 y.o. female referred to [facility] clinic for HTN management and palpitations. PMH is significant for GDM, cholestasis in pregnancy, and palpitations.  Patient currently 31w 1d pregnant with EDD 03/07/25. OB provider is [facility].  Patient seen by Dr Sheena on 12/5. Enrolled in [employer] study. Patient sent in BP readings on 12/15 and demonstrated controlled BP.  Not currently on any anti-hypertensives.  Presents today in good spirits. Reports constipation not relieved by docusate or Miralax . Currently on ursodiol , hydroxyzine  and iron tablets. Needed enema for relief last week.  Denies CP but has LEE. Wears compression socks.  GDM well controlled per patients Dexcom sensor. Not currently on aspirin.  Reports no longer having palpitations.  Scheduled for echo next week  Current HTN meds: N/A   Wt Readings from Last 3 Encounters:  12/26/24 208 lb (94.3 kg)  12/05/24 203 lb 12.8 oz (92.4 kg)  11/27/24 208 lb 9.6 oz (94.6 kg)   BP Readings from Last 3 Encounters:  01/01/25 120/69  12/26/24 106/78  12/05/24 128/76   Pulse Readings from Last 3 Encounters:  12/26/24 98  12/05/24 98  11/27/24 96    Renal function: Estimated Creatinine Clearance: 105.2 mL/min (A) (by C-G formula based on SCr of 0.43 mg/dL (L)).  Past Medical History:  Diagnosis Date   Anemia    Miscarriage 09/16/2023   Preterm labor     Medications Ordered Prior to Encounter[1]  Allergies[2]   Assessment/Plan:  1. Hypertension -  BP well controlled today in room at 119/76. No new medications needed at this time.  Advised to speak to [facility] provider next week regarding constipation and possible starting aspirin for preeclampsia prophylaxis. Iron, Actigall , and hydroxyzine  could be contributing to constipation.  Keep echo appt on Monday and f/u with Dr Sheena on 1/23. F/u as  needed.  Chris Vivien Barretto, PharmD, BCACP, CDCES, CPP [facility] [address], [place], [place] [address] Phone: [phone]; Fax: [phone] 01/06/2025 8:58 AM       [1]  Current Outpatient Medications on File Prior to Visit  Medication Sig Dispense Refill   Accu-Chek Softclix Lancets lancets 100 each by Other route 4 (four) times daily. 100 each 12   acetaminophen  (TYLENOL ) 500 MG tablet Take 500 mg by mouth every 6 (six) hours as needed.     Blood Glucose Monitoring Suppl (ACCU-CHEK GUIDE) w/Device KIT 1 kit by Does not apply route in the morning, at noon, in the evening, and at bedtime. (Patient not taking: Reported on 12/26/2024) 1 kit 0   Blood Pressure Monitoring (BLOOD PRESSURE KIT) DEVI 1 Device by Does not apply route once a week. (Patient not taking: Reported on 12/26/2024) 1 each 0   docusate sodium  (COLACE) 100 MG capsule Take 1 capsule (100 mg total) by mouth 2 (two) times daily. 60 capsule 11   Ferric Maltol  (ACCRUFER ) 30 MG CAPS Take 1 capsule (30 mg total) by mouth 2 (two) times daily before a meal. Take 2 hrs before, or 2 hrs after a meal. 60 capsule 3   glucose blood (ACCU-CHEK GUIDE TEST) test strip 1 each by Other route 4 (four) times daily. Use as instructed (Patient not  taking: Reported on 12/26/2024) 100 each 12   hydrOXYzine  (ATARAX ) 10 MG tablet TAKE 1 TABLET(10 MG) BY MOUTH THREE TIMES DAILY AS NEEDED 30 tablet 2   hydrOXYzine  (ATARAX ) 25 MG tablet Take 1 tablet (25 mg total) by mouth every 8 (eight) hours as needed for anxiety. 30 tablet 0   polyethylene glycol (MIRALAX ) 17 g packet Take 17 g by mouth at bedtime. 30 each 11   Prenatal Vit-Fe Phos-FA-Omega (VITAFOL  GUMMIES) 3.33-0.333-34.8 MG CHEW Chew 3 tablets by mouth daily. 90 tablet 11   ursodiol  (ACTIGALL ) 300 MG capsule Take 1 capsule (300 mg total) by mouth 2 (two) times daily. 60 capsule 5   No current facility-administered medications on file prior to visit.  [2] No Known Allergies

## 2025-01-08 ENCOUNTER — Ambulatory Visit (HOSPITAL_COMMUNITY)
Admission: RE | Admit: 2025-01-08 | Discharge: 2025-01-08 | Disposition: A | Discharge status: Home / Self Care | Source: Ambulatory Visit | Attending: Cardiology | Admitting: Cardiology

## 2025-01-08 DIAGNOSIS — R0609 Other forms of dyspnea: Secondary | ICD-10-CM | POA: Diagnosis not present

## 2025-01-08 LAB — ECHOCARDIOGRAM COMPLETE
Area-P 1/2: 6.43 cm2
S' Lateral: 2.77 cm

## 2025-01-09 ENCOUNTER — Ambulatory Visit: Payer: Self-pay | Admitting: Cardiology

## 2025-01-09 ENCOUNTER — Encounter: Payer: Self-pay | Admitting: Obstetrics & Gynecology

## 2025-01-09 ENCOUNTER — Ambulatory Visit: Payer: Self-pay | Admitting: Obstetrics & Gynecology

## 2025-01-09 VITALS — BP 120/77 | HR 94 | Wt 206.0 lb

## 2025-01-09 DIAGNOSIS — O09893 Supervision of other high risk pregnancies, third trimester: Secondary | ICD-10-CM

## 2025-01-09 DIAGNOSIS — O0990 Supervision of high risk pregnancy, unspecified, unspecified trimester: Secondary | ICD-10-CM

## 2025-01-09 DIAGNOSIS — Z3A31 31 weeks gestation of pregnancy: Secondary | ICD-10-CM | POA: Diagnosis not present

## 2025-01-09 DIAGNOSIS — O24410 Gestational diabetes mellitus in pregnancy, diet controlled: Secondary | ICD-10-CM

## 2025-01-09 DIAGNOSIS — O0993 Supervision of high risk pregnancy, unspecified, third trimester: Secondary | ICD-10-CM

## 2025-01-09 DIAGNOSIS — O09892 Supervision of other high risk pregnancies, second trimester: Secondary | ICD-10-CM

## 2025-01-09 DIAGNOSIS — O359XX0 Maternal care for (suspected) fetal abnormality and damage, unspecified, not applicable or unspecified: Secondary | ICD-10-CM

## 2025-01-09 DIAGNOSIS — O26643 Intrahepatic cholestasis of pregnancy, third trimester: Secondary | ICD-10-CM

## 2025-01-09 NOTE — Progress Notes (Signed)
 "  PRENATAL VISIT NOTE  Subjective:  Dana [name] is a 39 y.o. [phone] at [redacted]w[redacted]d being seen today for ongoing prenatal care.  She is currently monitored for the following issues for this high-risk pregnancy and has MDD (major depressive disorder), recurrent severe, without psychosis (HCC); History of suicidal ideation; Supervision of high risk pregnancy, antepartum; History of preterm delivery, currently pregnant in second trimester; History of preterm PROM in previous pregnancy, currently pregnant, second trimester; Cholestasis during pregnancy; Fetal abnormality affecting management of mother; GDM (gestational diabetes mellitus); and Advanced maternal age in multigravida on their problem list.  Patient reports itching and leg edema, fatigue.  Contractions: Irritability. Vag. Bleeding: None.  Movement: Present. Denies leaking of fluid.   The following portions of the patient's history were reviewed and updated as appropriate: allergies, current medications, past family history, past medical history, past social history, past surgical history and problem list.   Objective:   Vitals:   01/09/25 0914  BP: 120/77  Pulse: 94  Weight: 206 lb (93.4 kg)    Fetal Status:  Fetal Heart Rate (bpm): 136// NST   Movement: Present    General: Alert, oriented and cooperative. Patient is in no acute distress.  Skin: Skin is warm and dry. No rash noted.   Cardiovascular: Normal heart rate noted  Respiratory: Normal respiratory effort, no problems with respiration noted  Abdomen: Soft, gravid, appropriate for gestational age.  Pain/Pressure: Present     Pelvic: Cervical exam deferred        Extremities: Normal range of motion.  Edema: Trace  Mental Status: Normal mood and affect. Normal behavior. Normal judgment and thought content.      09/04/2024    3:56 PM 08/17/2024    9:05 AM 10/24/2023   10:31 AM  Depression screen PHQ 2/9  Decreased Interest 0 0 0  Down, Depressed, Hopeless 0 0 0  PHQ - 2  Score 0 0 0  Altered sleeping 2 0 0  Tired, decreased energy 3 0 1  Change in appetite 2 0 0  Feeling bad or failure about yourself  0 0 0  Trouble concentrating 0 0 0  Moving slowly or fidgety/restless 0 0 0  Suicidal thoughts 0 0 0  PHQ-9 Score 7  0  1      Data saved with a previous flowsheet row definition        09/04/2024    3:56 PM 08/17/2024    9:06 AM 10/24/2023   10:34 AM  GAD 7 : Generalized Anxiety Score  Nervous, Anxious, on Edge 0  1  1   Control/stop worrying 0  0  0   Worry too much - different things 0  0  0   Trouble relaxing 1  0  0   Restless 0  0  1   Easily annoyed or irritable 1  1  0   Afraid - awful might happen 0  0  0   Total GAD 7 Score 2 2 2      Data saved with a previous flowsheet row definition    Assessment and Plan:  Pregnancy: [id number] at [redacted]w[redacted]d 1. Supervision of high risk pregnancy, antepartum (Primary)  - Fetal nonstress test  2. [redacted] weeks gestation of pregnancy [redacted]w[redacted]d  - Fetal nonstress test  3. Fetal abnormality affecting management of mother, single or unspecified fetus   4. History of preterm delivery, currently pregnant in second trimester [redacted]w[redacted]d   5. Diet controlled gestational diabetes mellitus (GDM) in  third trimester FBS reported 95-100 with PP 120-150. Stressed dietary compliance and bring her BG log next week, consider metformin  6. Cholestasis during pregnancy in third trimester Multiple discomforts discussed, support her plan to start leave from work early February  Preterm labor symptoms and general obstetric precautions including but not limited to vaginal bleeding, contractions, leaking of fluid and fetal movement were reviewed in detail with the patient. Please refer to After Visit Summary for other counseling recommendations.   Return in about 1 week (around 01/16/2025).  Future Appointments  Date Time Provider Department Center  01/11/2025  4:20 PM Sheena Pugh, DO [facility] None  01/23/2025  3:15 PM [facility]  PROVIDER 1 [facility]  01/23/2025  3:30 PM [facility] US3 [facility]  01/30/2025  3:15 PM [facility] PROVIDER 1 [facility]  01/30/2025  3:30 PM [facility] US3 [facility]  02/06/2025  3:15 PM [facility] PROVIDER 1 [facility]  02/06/2025  3:30 PM [facility] US3 [facility]    Lynwood Solomons, MD  "

## 2025-01-09 NOTE — Progress Notes (Signed)
ROB NST 

## 2025-01-11 ENCOUNTER — Ambulatory Visit: Admitting: Cardiology

## 2025-01-18 ENCOUNTER — Ambulatory Visit: Payer: Self-pay | Admitting: Obstetrics & Gynecology

## 2025-01-18 VITALS — BP 115/75 | HR 102 | Wt 208.0 lb

## 2025-01-18 DIAGNOSIS — Z3A33 33 weeks gestation of pregnancy: Secondary | ICD-10-CM | POA: Diagnosis not present

## 2025-01-18 DIAGNOSIS — O09292 Supervision of pregnancy with other poor reproductive or obstetric history, second trimester: Secondary | ICD-10-CM

## 2025-01-18 DIAGNOSIS — O24410 Gestational diabetes mellitus in pregnancy, diet controlled: Secondary | ICD-10-CM

## 2025-01-18 DIAGNOSIS — O26643 Intrahepatic cholestasis of pregnancy, third trimester: Secondary | ICD-10-CM | POA: Diagnosis not present

## 2025-01-18 DIAGNOSIS — O0993 Supervision of high risk pregnancy, unspecified, third trimester: Secondary | ICD-10-CM

## 2025-01-18 DIAGNOSIS — Z23 Encounter for immunization: Secondary | ICD-10-CM | POA: Diagnosis not present

## 2025-01-18 DIAGNOSIS — O2686 Pruritic urticarial papules and plaques of pregnancy (PUPPP): Secondary | ICD-10-CM | POA: Insufficient documentation

## 2025-01-18 DIAGNOSIS — O0990 Supervision of high risk pregnancy, unspecified, unspecified trimester: Secondary | ICD-10-CM

## 2025-01-18 DIAGNOSIS — O09293 Supervision of pregnancy with other poor reproductive or obstetric history, third trimester: Secondary | ICD-10-CM | POA: Diagnosis not present

## 2025-01-18 MED ORDER — PREDNISONE 5 MG PO TABS: 5.0000 mg | ORAL_TABLET | Freq: Every day | ORAL | 1 refills | Status: AC

## 2025-01-18 NOTE — Progress Notes (Addendum)
 ROB/NST.  TDAP given in LD, tolerated well.  C/o cramps/contractions. Pt was to stop working now.

## 2025-01-18 NOTE — Progress Notes (Signed)
 "  PRENATAL VISIT NOTE  Subjective:  Dana [name] is a 39 y.o. [phone] at [redacted]w[redacted]d being seen today for ongoing prenatal care.  She is currently monitored for the following issues for this high-risk pregnancy and has MDD (major depressive disorder), recurrent severe, without psychosis (HCC); History of suicidal ideation; Supervision of high risk pregnancy, antepartum; History of preterm delivery, currently pregnant in second trimester; History of preterm PROM in previous pregnancy, currently pregnant, second trimester; Cholestasis during pregnancy; Fetal abnormality affecting management of mother; GDM (gestational diabetes mellitus); and Advanced maternal age in multigravida on their problem list.  Patient reports backache, headache, and pelvic pressure, hand and feet swelling and significant itching associate with cholestasis and now PUPPP.  Contractions: Irritability. Vag. Bleeding: None.  Movement: Present. Denies leaking of fluid.   The following portions of the patient's history were reviewed and updated as appropriate: allergies, current medications, past family history, past medical history, past social history, past surgical history and problem list.   Objective:   Vitals:   01/18/25 0908  BP: 115/75  Pulse: (!) 102  Weight: 208 lb (94.3 kg)    Fetal Status:  Fetal Heart Rate (bpm): NST   Movement: Present    General: Alert, oriented and cooperative. Patient is in no acute distress.  Skin: Skin is warm and dry.  Rash on her back c/w PUPPP  Cardiovascular: Normal heart rate noted  Respiratory: Normal respiratory effort, no problems with respiration noted  Abdomen: Soft, gravid, appropriate for gestational age.  Pain/Pressure: Present     Pelvic: Cervical exam deferred        Extremities: Normal range of motion.  Edema: Trace  Mental Status: Normal mood and affect. Normal behavior. Normal judgment and thought content.      09/04/2024    3:56 PM 08/17/2024    9:05 AM 10/24/2023    10:31 AM  Depression screen PHQ 2/9  Decreased Interest 0 0 0  Down, Depressed, Hopeless 0 0 0  PHQ - 2 Score 0 0 0  Altered sleeping 2 0 0  Tired, decreased energy 3 0 1  Change in appetite 2 0 0  Feeling bad or failure about yourself  0 0 0  Trouble concentrating 0 0 0  Moving slowly or fidgety/restless 0 0 0  Suicidal thoughts 0 0 0  PHQ-9 Score 7  0  1      Data saved with a previous flowsheet row definition        09/04/2024    3:56 PM 08/17/2024    9:06 AM 10/24/2023   10:34 AM  GAD 7 : Generalized Anxiety Score  Nervous, Anxious, on Edge 0  1  1   Control/stop worrying 0  0  0   Worry too much - different things 0  0  0   Trouble relaxing 1  0  0   Restless 0  0  1   Easily annoyed or irritable 1  1  0   Afraid - awful might happen 0  0  0   Total GAD 7 Score 2 2 2      Data saved with a previous flowsheet row definition    Assessment and Plan:  Pregnancy: [id number] at [redacted]w[redacted]d 1. Supervision of high risk pregnancy, antepartum (Primary) Cholestasis with itching not well controlled - Fetal nonstress test Reactive NST today 2. [redacted] weeks gestation of pregnancy  - Fetal nonstress test  3. Diet controlled gestational diabetes mellitus (GDM) in third trimester <90% in  range on Dexcom  4. Cholestasis during pregnancy in third trimester Atarax  not helping itch. Continue Ursidiol  5. History of preterm PROM in previous pregnancy, currently pregnant, second trimester  Recommend she stop work due to significant complications with cholestasis of pregnancy Meds ordered this encounter  Medications   predniSONE  (DELTASONE ) 5 MG tablet    Sig: Take 1 tablet (5 mg total) by mouth daily with breakfast.    Dispense:  30 tablet    Refill:  1   For PUPPP  Preterm labor symptoms and general obstetric precautions including but not limited to vaginal bleeding, contractions, leaking of fluid and fetal movement were reviewed in detail with the patient. Please refer to After Visit  Summary for other counseling recommendations.   Return in about 1 week (around 01/25/2025).  Future Appointments  Date Time Provider Department Center  01/23/2025  3:15 PM [facility] PROVIDER 1 [facility]  01/23/2025  3:30 PM [facility] US3 [facility]  01/30/2025  3:15 PM [facility] PROVIDER 1 [facility]  01/30/2025  3:30 PM [facility] US3 [facility]  02/06/2025  3:15 PM [facility] PROVIDER 1 [facility]  02/06/2025  3:30 PM [facility] US3 [facility]    Lynwood Solomons, MD  "

## 2025-01-23 ENCOUNTER — Ambulatory Visit: Attending: Obstetrics and Gynecology | Admitting: Obstetrics

## 2025-01-23 ENCOUNTER — Other Ambulatory Visit: Payer: Self-pay | Admitting: *Deleted

## 2025-01-23 ENCOUNTER — Ambulatory Visit

## 2025-01-23 VITALS — BP 121/75

## 2025-01-23 DIAGNOSIS — O26643 Intrahepatic cholestasis of pregnancy, third trimester: Secondary | ICD-10-CM

## 2025-01-23 DIAGNOSIS — O24410 Gestational diabetes mellitus in pregnancy, diet controlled: Secondary | ICD-10-CM | POA: Insufficient documentation

## 2025-01-23 DIAGNOSIS — O283 Abnormal ultrasonic finding on antenatal screening of mother: Secondary | ICD-10-CM | POA: Diagnosis not present

## 2025-01-23 DIAGNOSIS — Z3A33 33 weeks gestation of pregnancy: Secondary | ICD-10-CM

## 2025-01-23 DIAGNOSIS — O09523 Supervision of elderly multigravida, third trimester: Secondary | ICD-10-CM | POA: Insufficient documentation

## 2025-01-23 DIAGNOSIS — O99213 Obesity complicating pregnancy, third trimester: Secondary | ICD-10-CM | POA: Insufficient documentation

## 2025-01-23 DIAGNOSIS — O0990 Supervision of high risk pregnancy, unspecified, unspecified trimester: Secondary | ICD-10-CM

## 2025-01-23 DIAGNOSIS — O289 Unspecified abnormal findings on antenatal screening of mother: Secondary | ICD-10-CM | POA: Insufficient documentation

## 2025-01-23 DIAGNOSIS — Z362 Encounter for other antenatal screening follow-up: Secondary | ICD-10-CM | POA: Insufficient documentation

## 2025-01-23 DIAGNOSIS — E669 Obesity, unspecified: Secondary | ICD-10-CM

## 2025-01-23 DIAGNOSIS — O09213 Supervision of pregnancy with history of pre-term labor, third trimester: Secondary | ICD-10-CM | POA: Insufficient documentation

## 2025-01-23 NOTE — Progress Notes (Signed)
 MFM Consult Note  KONI KANNAN is currently at [redacted]w[redacted]d. She has been followed due to advanced maternal age (39 years old), cholestasis of pregnancy currently treated with Actigall , and diet-controlled gestational diabetes.    She reports that the majority of her fingerstick values have been within normal limits.  She continues to experience itching related to cholestasis.  She was also recently diagnosed with a PUPPS  rash.  Her blood pressure today was 121/75.  Sonographic findings Single intrauterine pregnancy at 33w 6d. Fetal cardiac activity: Observed. Presentation: Cephalic. Amniotic fluid: Within normal limits. AFI: 11.32 cm,  MVP: 4.87 cm. Placenta: Anterior. BPP: 8/8.   Gestational diabetes  She was advised to continue monitoring her fingerstick values on a daily basis.    The goals for her glycemic control were reviewed.  Cholestasis of pregnancy  She was advised to continue taking Actigall  for the remainder of her pregnancy.   She was advised to take 1 extra tablet of Actigall  daily should her itching continue to be severe.    The increased risk of fetal demise associated with cholestasis was discussed.    We will continue to follow her with weekly fetal testing until delivery.    She was advised to monitor fetal movements on a daily basis.    Delivery should occur at around 37 weeks.    She will return in 1 week for another BPP.    The patient stated that all of her questions were answered.   A total of 20 minutes was spent counseling and coordinating the care for this patient.  Greater than 50% of the time was spent in direct face-to-face contact.

## 2025-01-23 NOTE — Progress Notes (Signed)
 Patient reports that she checks blood glucose at home and readings are as follows: Fasting levels range from 70 to 124, and two hour post-meal reading ranges from 150.to 180.

## 2025-01-29 ENCOUNTER — Encounter: Payer: Self-pay | Admitting: Family Medicine

## 2025-01-30 ENCOUNTER — Ambulatory Visit

## 2025-02-06 ENCOUNTER — Ambulatory Visit
